# Patient Record
Sex: Female | Born: 1935 | Race: White | Hispanic: No | Marital: Married | Smoking: Never smoker
Health system: Southern US, Community
[De-identification: ages and names within clinical notes are randomized; demographics above are authoritative.]

## PROBLEM LIST (undated history)

## (undated) ENCOUNTER — Emergency Department (HOSPITAL_COMMUNITY): Admission: EM | Discharge status: Against Medical Advice | Payer: Medicare Other

## (undated) DIAGNOSIS — I10 Essential (primary) hypertension: Secondary | ICD-10-CM

## (undated) DIAGNOSIS — M4800 Spinal stenosis, site unspecified: Secondary | ICD-10-CM

## (undated) DIAGNOSIS — M8580 Other specified disorders of bone density and structure, unspecified site: Secondary | ICD-10-CM

## (undated) DIAGNOSIS — M1990 Unspecified osteoarthritis, unspecified site: Secondary | ICD-10-CM

## (undated) HISTORY — PX: ABDOMINAL HYSTERECTOMY: SHX81

## (undated) HISTORY — DX: Unspecified osteoarthritis, unspecified site: M19.90

## (undated) HISTORY — DX: Spinal stenosis, site unspecified: M48.00

## (undated) HISTORY — DX: Other specified disorders of bone density and structure, unspecified site: M85.80

---

## 1999-10-12 ENCOUNTER — Encounter: Payer: Self-pay | Admitting: Internal Medicine

## 1999-10-12 ENCOUNTER — Encounter: Admission: RE | Admit: 1999-10-12 | Discharge: 1999-10-12 | Payer: Self-pay | Admitting: Internal Medicine

## 1999-12-09 ENCOUNTER — Encounter: Admission: RE | Admit: 1999-12-09 | Discharge: 1999-12-09 | Payer: Self-pay | Admitting: *Deleted

## 1999-12-09 ENCOUNTER — Encounter: Payer: Self-pay | Admitting: *Deleted

## 2000-10-13 ENCOUNTER — Encounter: Admission: RE | Admit: 2000-10-13 | Discharge: 2000-10-13 | Payer: Self-pay | Admitting: Internal Medicine

## 2000-10-13 ENCOUNTER — Encounter: Payer: Self-pay | Admitting: Internal Medicine

## 2001-10-17 ENCOUNTER — Encounter: Payer: Self-pay | Admitting: Internal Medicine

## 2001-10-17 ENCOUNTER — Encounter: Admission: RE | Admit: 2001-10-17 | Discharge: 2001-10-17 | Payer: Self-pay | Admitting: Internal Medicine

## 2001-10-25 ENCOUNTER — Encounter: Payer: Self-pay | Admitting: Internal Medicine

## 2001-10-25 ENCOUNTER — Encounter: Admission: RE | Admit: 2001-10-25 | Discharge: 2001-10-25 | Payer: Self-pay | Admitting: Internal Medicine

## 2002-12-12 ENCOUNTER — Encounter: Admission: RE | Admit: 2002-12-12 | Discharge: 2002-12-12 | Payer: Self-pay | Admitting: Internal Medicine

## 2002-12-12 ENCOUNTER — Encounter: Payer: Self-pay | Admitting: Internal Medicine

## 2003-10-01 ENCOUNTER — Ambulatory Visit (HOSPITAL_COMMUNITY): Admission: RE | Admit: 2003-10-01 | Discharge: 2003-10-01 | Payer: Self-pay | Admitting: Internal Medicine

## 2003-10-26 ENCOUNTER — Encounter: Admission: RE | Admit: 2003-10-26 | Discharge: 2003-10-26 | Payer: Self-pay | Admitting: Internal Medicine

## 2003-12-13 ENCOUNTER — Encounter: Admission: RE | Admit: 2003-12-13 | Discharge: 2003-12-13 | Payer: Self-pay | Admitting: Internal Medicine

## 2004-03-05 LAB — HM COLONOSCOPY: HM Colonoscopy: NORMAL

## 2004-12-24 ENCOUNTER — Encounter: Admission: RE | Admit: 2004-12-24 | Discharge: 2004-12-24 | Payer: Self-pay | Admitting: Internal Medicine

## 2005-02-09 ENCOUNTER — Ambulatory Visit: Payer: Self-pay | Admitting: Internal Medicine

## 2005-06-24 ENCOUNTER — Ambulatory Visit: Payer: Self-pay | Admitting: Family Medicine

## 2005-07-26 ENCOUNTER — Ambulatory Visit: Payer: Self-pay | Admitting: Internal Medicine

## 2005-09-14 ENCOUNTER — Ambulatory Visit: Payer: Self-pay | Admitting: Internal Medicine

## 2005-10-05 ENCOUNTER — Ambulatory Visit: Payer: Self-pay | Admitting: Internal Medicine

## 2006-01-06 ENCOUNTER — Ambulatory Visit: Payer: Self-pay | Admitting: Internal Medicine

## 2006-02-25 ENCOUNTER — Encounter: Admission: RE | Admit: 2006-02-25 | Discharge: 2006-02-25 | Payer: Self-pay | Admitting: Internal Medicine

## 2006-03-21 ENCOUNTER — Ambulatory Visit: Payer: Self-pay | Admitting: Internal Medicine

## 2006-06-20 ENCOUNTER — Ambulatory Visit: Payer: Self-pay | Admitting: Family Medicine

## 2006-08-01 ENCOUNTER — Ambulatory Visit: Payer: Self-pay | Admitting: Internal Medicine

## 2006-08-08 ENCOUNTER — Ambulatory Visit: Payer: Self-pay | Admitting: Internal Medicine

## 2006-11-15 HISTORY — PX: ROTATOR CUFF REPAIR: SHX139

## 2007-03-08 ENCOUNTER — Encounter: Admission: RE | Admit: 2007-03-08 | Discharge: 2007-03-08 | Payer: Self-pay | Admitting: Internal Medicine

## 2007-03-21 ENCOUNTER — Ambulatory Visit: Payer: Self-pay | Admitting: Internal Medicine

## 2007-03-21 LAB — CONVERTED CEMR LAB
ALT: 16 units/L (ref 0–40)
Basophils Absolute: 0 10*3/uL (ref 0.0–0.1)
Basophils Relative: 0.1 % (ref 0.0–1.0)
CO2: 30 meq/L (ref 19–32)
Calcium: 9.3 mg/dL (ref 8.4–10.5)
Eosinophils Absolute: 0.1 10*3/uL (ref 0.0–0.6)
Hemoglobin: 13.5 g/dL (ref 12.0–15.0)
Lymphocytes Relative: 23.2 % (ref 12.0–46.0)
MCHC: 34.2 g/dL (ref 30.0–36.0)
MCV: 102.8 fL — ABNORMAL HIGH (ref 78.0–100.0)
Monocytes Absolute: 0.2 10*3/uL (ref 0.2–0.7)
Neutro Abs: 2.2 10*3/uL (ref 1.4–7.7)
Neutrophils Relative %: 64.3 % (ref 43.0–77.0)
Platelets: 263 10*3/uL (ref 150–400)
Potassium: 4.2 meq/L (ref 3.5–5.1)
Sodium: 144 meq/L (ref 135–145)
Total CHOL/HDL Ratio: 2.9
Vit D, 1,25-Dihydroxy: 61 — ABNORMAL HIGH (ref 20–57)

## 2007-07-25 ENCOUNTER — Ambulatory Visit: Payer: Self-pay | Admitting: Internal Medicine

## 2007-07-25 DIAGNOSIS — M899 Disorder of bone, unspecified: Secondary | ICD-10-CM | POA: Insufficient documentation

## 2007-07-25 DIAGNOSIS — M949 Disorder of cartilage, unspecified: Secondary | ICD-10-CM

## 2007-07-25 DIAGNOSIS — D709 Neutropenia, unspecified: Secondary | ICD-10-CM | POA: Insufficient documentation

## 2007-07-25 DIAGNOSIS — M4800 Spinal stenosis, site unspecified: Secondary | ICD-10-CM | POA: Insufficient documentation

## 2007-07-25 DIAGNOSIS — M1990 Unspecified osteoarthritis, unspecified site: Secondary | ICD-10-CM | POA: Insufficient documentation

## 2007-07-25 DIAGNOSIS — J309 Allergic rhinitis, unspecified: Secondary | ICD-10-CM | POA: Insufficient documentation

## 2007-07-31 ENCOUNTER — Telehealth (INDEPENDENT_AMBULATORY_CARE_PROVIDER_SITE_OTHER): Payer: Self-pay | Admitting: *Deleted

## 2007-09-06 ENCOUNTER — Encounter: Payer: Self-pay | Admitting: Internal Medicine

## 2007-09-14 ENCOUNTER — Telehealth (INDEPENDENT_AMBULATORY_CARE_PROVIDER_SITE_OTHER): Payer: Self-pay | Admitting: *Deleted

## 2007-09-19 ENCOUNTER — Ambulatory Visit: Payer: Self-pay | Admitting: Internal Medicine

## 2007-09-25 LAB — CONVERTED CEMR LAB
Cholesterol: 210 mg/dL (ref 0–200)
Eosinophils Relative: 6.2 % — ABNORMAL HIGH (ref 0.0–5.0)
HDL: 66.3 mg/dL (ref >39.0)
Hemoglobin: 13.9 g/dL (ref 12.0–15.0)
RBC: 3.87 M/uL (ref 3.87–5.11)
Total CHOL/HDL Ratio: 3.2
VLDL: 10 mg/dL (ref 0–40)
WBC: 2.9 10*3/uL — ABNORMAL LOW (ref 4.5–10.5)

## 2007-10-04 ENCOUNTER — Ambulatory Visit: Payer: Self-pay | Admitting: Internal Medicine

## 2007-10-04 DIAGNOSIS — H9319 Tinnitus, unspecified ear: Secondary | ICD-10-CM | POA: Insufficient documentation

## 2007-11-01 ENCOUNTER — Ambulatory Visit: Payer: Self-pay | Admitting: Internal Medicine

## 2007-11-06 ENCOUNTER — Ambulatory Visit: Payer: Self-pay | Admitting: Internal Medicine

## 2007-11-06 LAB — CONVERTED CEMR LAB
Basophils Absolute: 0 10*3/uL (ref 0.0–0.1)
Basophils Relative: 0.3 % (ref 0.0–1.0)
Eosinophils Absolute: 0.2 10*3/uL (ref 0.0–0.6)
Eosinophils Relative: 3.3 % (ref 0.0–5.0)
Glucose, Urine, Semiquant: NEGATIVE
HCT: 36.9 % (ref 36.0–46.0)
Ketones, urine, test strip: NEGATIVE
Lymphocytes Relative: 22.6 % (ref 12.0–46.0)
MCHC: 34.3 g/dL (ref 30.0–36.0)
Monocytes Absolute: 0.3 10*3/uL (ref 0.2–0.7)
Monocytes Relative: 6.4 % (ref 3.0–11.0)
Platelets: 237 10*3/uL (ref 150–400)
RBC: 3.62 M/uL — ABNORMAL LOW (ref 3.87–5.11)
RDW: 11.7 % (ref 11.5–14.6)
WBC: 4.9 10*3/uL (ref 4.5–10.5)

## 2007-11-07 ENCOUNTER — Encounter: Payer: Self-pay | Admitting: Internal Medicine

## 2007-11-23 ENCOUNTER — Telehealth: Payer: Self-pay | Admitting: Internal Medicine

## 2007-12-13 ENCOUNTER — Telehealth: Payer: Self-pay | Admitting: Internal Medicine

## 2007-12-15 ENCOUNTER — Telehealth: Payer: Self-pay | Admitting: Internal Medicine

## 2008-03-11 ENCOUNTER — Encounter: Admission: RE | Admit: 2008-03-11 | Discharge: 2008-03-11 | Payer: Self-pay | Admitting: Internal Medicine

## 2008-04-22 ENCOUNTER — Ambulatory Visit: Payer: Self-pay | Admitting: Internal Medicine

## 2008-04-22 DIAGNOSIS — E785 Hyperlipidemia, unspecified: Secondary | ICD-10-CM | POA: Insufficient documentation

## 2008-04-22 DIAGNOSIS — G4700 Insomnia, unspecified: Secondary | ICD-10-CM | POA: Insufficient documentation

## 2008-05-07 LAB — CONVERTED CEMR LAB
ALT: 15 units/L (ref 0–35)
AST: 21 units/L (ref 0–37)
Albumin: 4.3 g/dL (ref 3.5–5.2)
Alkaline Phosphatase: 62 units/L (ref 39–117)
Bilirubin, Direct: 0.1 mg/dL (ref 0.0–0.3)
CO2: 28 meq/L (ref 19–32)
Cholesterol: 211 mg/dL (ref 0–200)
Creatinine, Ser: 0.8 mg/dL (ref 0.4–1.2)
GFR calc non Af Amer: 74 mL/min
Glucose, Bld: 97 mg/dL (ref 70–99)
HCT: 40.9 % (ref 36.0–46.0)
HDL: 73.8 mg/dL (ref >39.0)
Hemoglobin: 14.2 g/dL (ref 12.0–15.0)
MCHC: 34.7 g/dL (ref 30.0–36.0)
Neutrophils Relative %: 63.2 % (ref 43.0–77.0)
Potassium: 5.1 meq/L (ref 3.5–5.1)
RDW: 12.5 % (ref 11.5–14.6)
Sodium: 142 meq/L (ref 135–145)
Total Protein: 6.6 g/dL (ref 6.0–8.3)
Triglycerides: 72 mg/dL (ref 0–149)
WBC: 3.4 10*3/uL — ABNORMAL LOW (ref 4.5–10.5)

## 2008-08-05 ENCOUNTER — Encounter: Payer: Self-pay | Admitting: Internal Medicine

## 2008-08-05 ENCOUNTER — Ambulatory Visit: Payer: Self-pay | Admitting: Internal Medicine

## 2008-09-23 ENCOUNTER — Telehealth: Payer: Self-pay | Admitting: Internal Medicine

## 2008-10-28 ENCOUNTER — Telehealth: Payer: Self-pay | Admitting: Internal Medicine

## 2009-03-27 ENCOUNTER — Encounter: Admission: RE | Admit: 2009-03-27 | Discharge: 2009-03-27 | Payer: Self-pay | Admitting: Internal Medicine

## 2009-05-09 ENCOUNTER — Ambulatory Visit: Payer: Self-pay | Admitting: Internal Medicine

## 2009-05-12 ENCOUNTER — Telehealth: Payer: Self-pay | Admitting: Internal Medicine

## 2009-05-26 ENCOUNTER — Ambulatory Visit: Payer: Self-pay | Admitting: Internal Medicine

## 2009-05-27 ENCOUNTER — Encounter: Payer: Self-pay | Admitting: Internal Medicine

## 2009-05-30 LAB — CONVERTED CEMR LAB
ALT: 13 units/L (ref 0–35)
AST: 20 units/L (ref 0–37)
BUN: 19 mg/dL (ref 6–23)
Basophils Absolute: 0 10*3/uL (ref 0.0–0.1)
CO2: 29 meq/L (ref 19–32)
Cholesterol: 237 mg/dL — ABNORMAL HIGH (ref 0–200)
Eosinophils Relative: 4.9 % (ref 0.0–5.0)
GFR calc non Af Amer: 64.65 mL/min (ref >60)
Glucose, Bld: 99 mg/dL (ref 70–99)
HCT: 40.5 % (ref 36.0–46.0)
HDL: 68.6 mg/dL (ref >39.00)
Lymphocytes Relative: 18.6 % (ref 12.0–46.0)
Lymphs Abs: 0.6 10*3/uL — ABNORMAL LOW (ref 0.7–4.0)
TSH: 2.77 microintl units/mL (ref 0.35–5.50)
VLDL: 17.4 mg/dL (ref 0.0–40.0)
WBC: 3.2 10*3/uL — ABNORMAL LOW (ref 4.5–10.5)

## 2009-06-26 ENCOUNTER — Ambulatory Visit: Payer: Self-pay | Admitting: Family Medicine

## 2009-06-26 LAB — CONVERTED CEMR LAB
Bilirubin Urine: NEGATIVE
Nitrite: POSITIVE
Specific Gravity, Urine: 1.02
Urobilinogen, UA: 0.2

## 2009-10-16 ENCOUNTER — Ambulatory Visit: Payer: Self-pay | Admitting: Internal Medicine

## 2009-10-20 LAB — CONVERTED CEMR LAB
Basophils Absolute: 0 10*3/uL (ref 0.0–0.1)
Hemoglobin: 12.7 g/dL (ref 12.0–15.0)
Lymphocytes Relative: 13.7 % (ref 12.0–46.0)
Lymphs Abs: 0.7 10*3/uL (ref 0.7–4.0)
MCHC: 34 g/dL (ref 30.0–36.0)
MCV: 106.4 fL — ABNORMAL HIGH (ref 78.0–100.0)
Monocytes Relative: 6.6 % (ref 3.0–12.0)
Neutro Abs: 3.7 10*3/uL (ref 1.4–7.7)
Neutrophils Relative %: 77.2 % — ABNORMAL HIGH (ref 43.0–77.0)
RBC: 3.52 M/uL — ABNORMAL LOW (ref 3.87–5.11)
WBC: 4.8 10*3/uL (ref 4.5–10.5)

## 2009-12-22 ENCOUNTER — Ambulatory Visit: Payer: Self-pay | Admitting: Internal Medicine

## 2009-12-22 DIAGNOSIS — R82998 Other abnormal findings in urine: Secondary | ICD-10-CM | POA: Insufficient documentation

## 2009-12-22 LAB — CONVERTED CEMR LAB
Bilirubin Urine: NEGATIVE
Nitrite: POSITIVE
Protein, U semiquant: NEGATIVE
Specific Gravity, Urine: 1.02

## 2010-01-07 ENCOUNTER — Telehealth: Payer: Self-pay | Admitting: *Deleted

## 2010-01-07 ENCOUNTER — Ambulatory Visit: Payer: Self-pay | Admitting: Internal Medicine

## 2010-01-07 LAB — CONVERTED CEMR LAB
Bilirubin Urine: NEGATIVE
Ketones, ur: NEGATIVE mg/dL
Nitrite: POSITIVE
Total Protein, Urine: 100 mg/dL
Urine Glucose: NEGATIVE mg/dL
pH: 6 (ref 5.0–8.0)

## 2010-02-04 ENCOUNTER — Telehealth: Payer: Self-pay | Admitting: *Deleted

## 2010-03-31 ENCOUNTER — Encounter: Admission: RE | Admit: 2010-03-31 | Discharge: 2010-03-31 | Payer: Self-pay | Admitting: Internal Medicine

## 2010-03-31 LAB — HM MAMMOGRAPHY

## 2010-05-12 ENCOUNTER — Ambulatory Visit: Payer: Self-pay | Admitting: Internal Medicine

## 2010-05-12 LAB — CONVERTED CEMR LAB
ALT: 16 units/L (ref 0–35)
AST: 20 units/L (ref 0–37)
Bilirubin Urine: NEGATIVE
CO2: 31 meq/L (ref 19–32)
Calcium: 9.5 mg/dL (ref 8.4–10.5)
Cholesterol: 244 mg/dL — ABNORMAL HIGH (ref 0–200)
Creatinine, Ser: 0.9 mg/dL (ref 0.4–1.2)
Eosinophils Absolute: 0.1 10*3/uL (ref 0.0–0.7)
Glucose, Bld: 92 mg/dL (ref 70–99)
HDL: 84.8 mg/dL (ref >39.00)
Lymphocytes Relative: 26.7 % (ref 12.0–46.0)
Monocytes Absolute: 0.3 10*3/uL (ref 0.1–1.0)
Neutrophils Relative %: 60.8 % (ref 43.0–77.0)
Nitrite: NEGATIVE
Platelets: 224 10*3/uL (ref 150.0–400.0)
RBC: 3.89 M/uL (ref 3.87–5.11)
Sodium: 144 meq/L (ref 135–145)
Total Bilirubin: 1.1 mg/dL (ref 0.3–1.2)
Total CHOL/HDL Ratio: 3
Total Protein: 6.7 g/dL (ref 6.0–8.3)
Triglycerides: 58 mg/dL (ref 0.0–149.0)
VLDL: 11.6 mg/dL (ref 0.0–40.0)
pH: 6

## 2010-05-27 ENCOUNTER — Ambulatory Visit: Payer: Self-pay | Admitting: Internal Medicine

## 2010-11-19 ENCOUNTER — Ambulatory Visit
Admission: RE | Admit: 2010-11-19 | Discharge: 2010-11-19 | Payer: Self-pay | Source: Home / Self Care | Attending: Internal Medicine | Admitting: Internal Medicine

## 2010-11-19 DIAGNOSIS — J45901 Unspecified asthma with (acute) exacerbation: Secondary | ICD-10-CM | POA: Insufficient documentation

## 2010-11-24 ENCOUNTER — Telehealth: Payer: Self-pay | Admitting: Internal Medicine

## 2010-11-24 IMAGING — CR DG CHEST 2V
2 series · 2 of 2 positions shown · non-contrast
Comparison: None available.

CLINICAL DATA: Back pain.

CHEST - 2 VIEW

[view not recorded (1 of 2)]
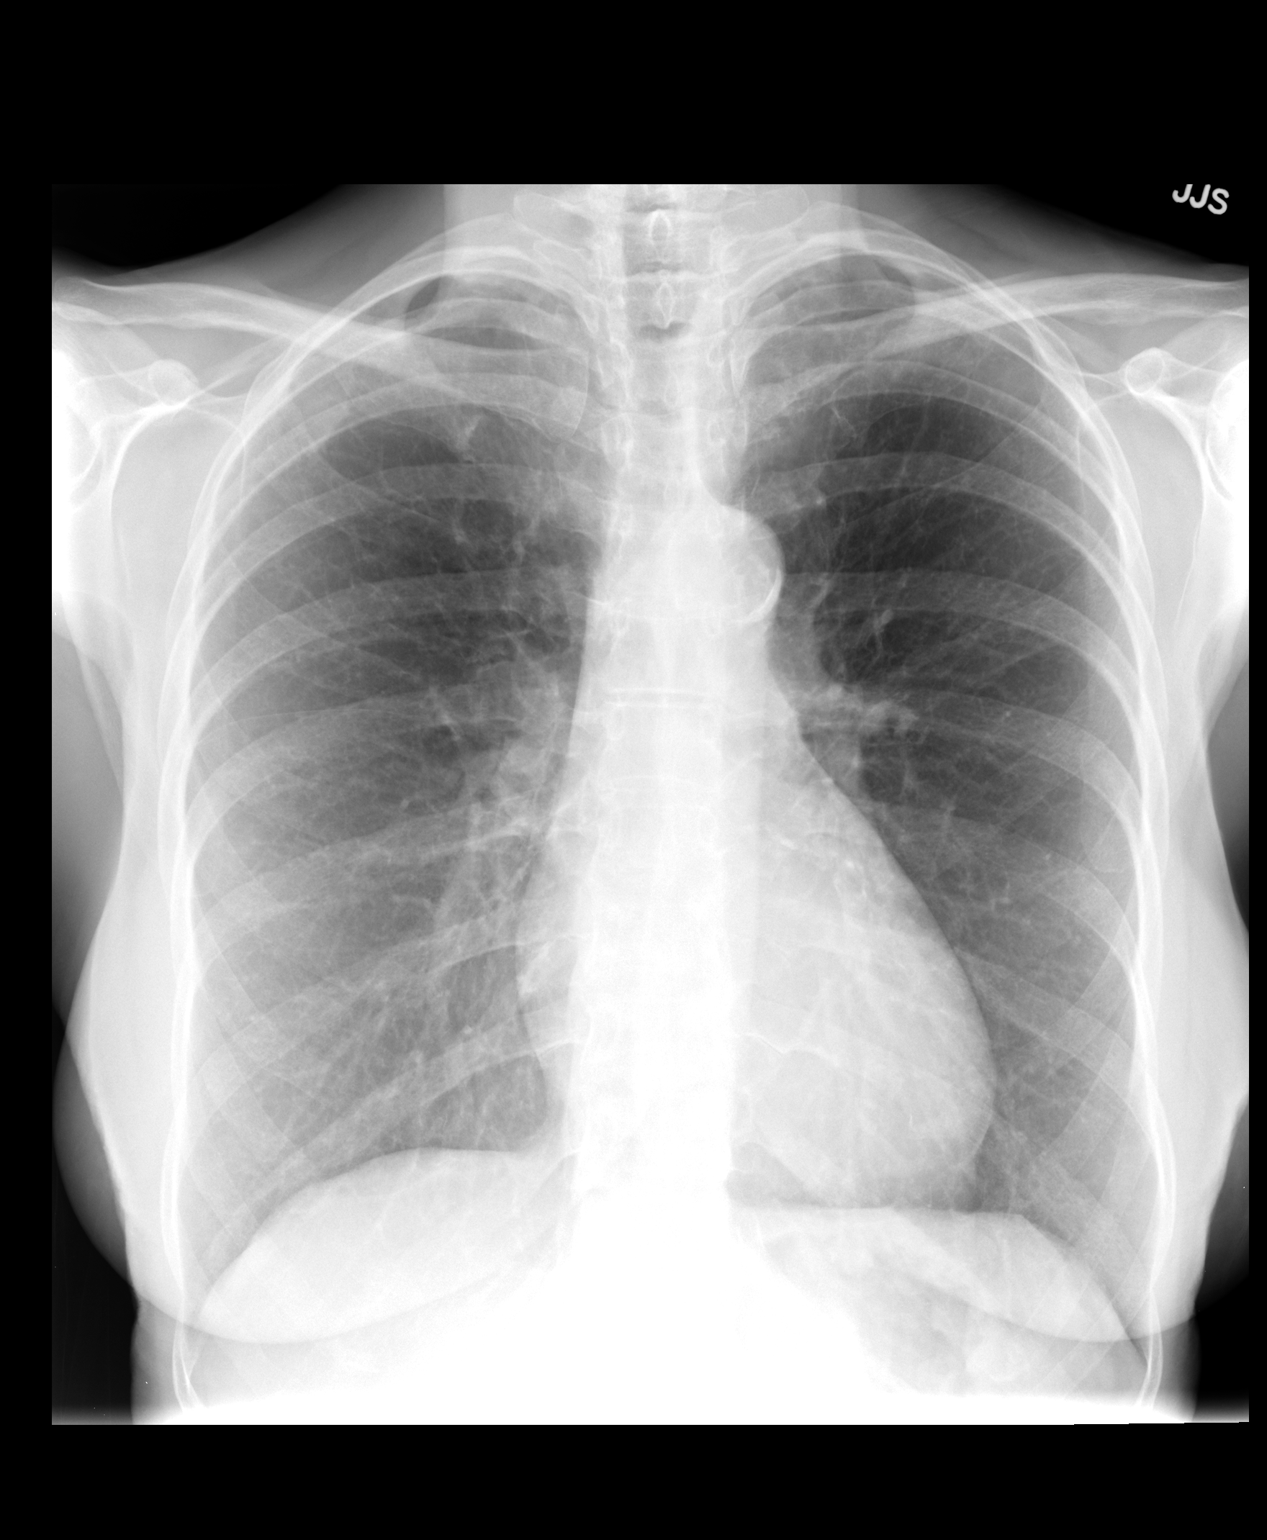

[view not recorded (2 of 2)]
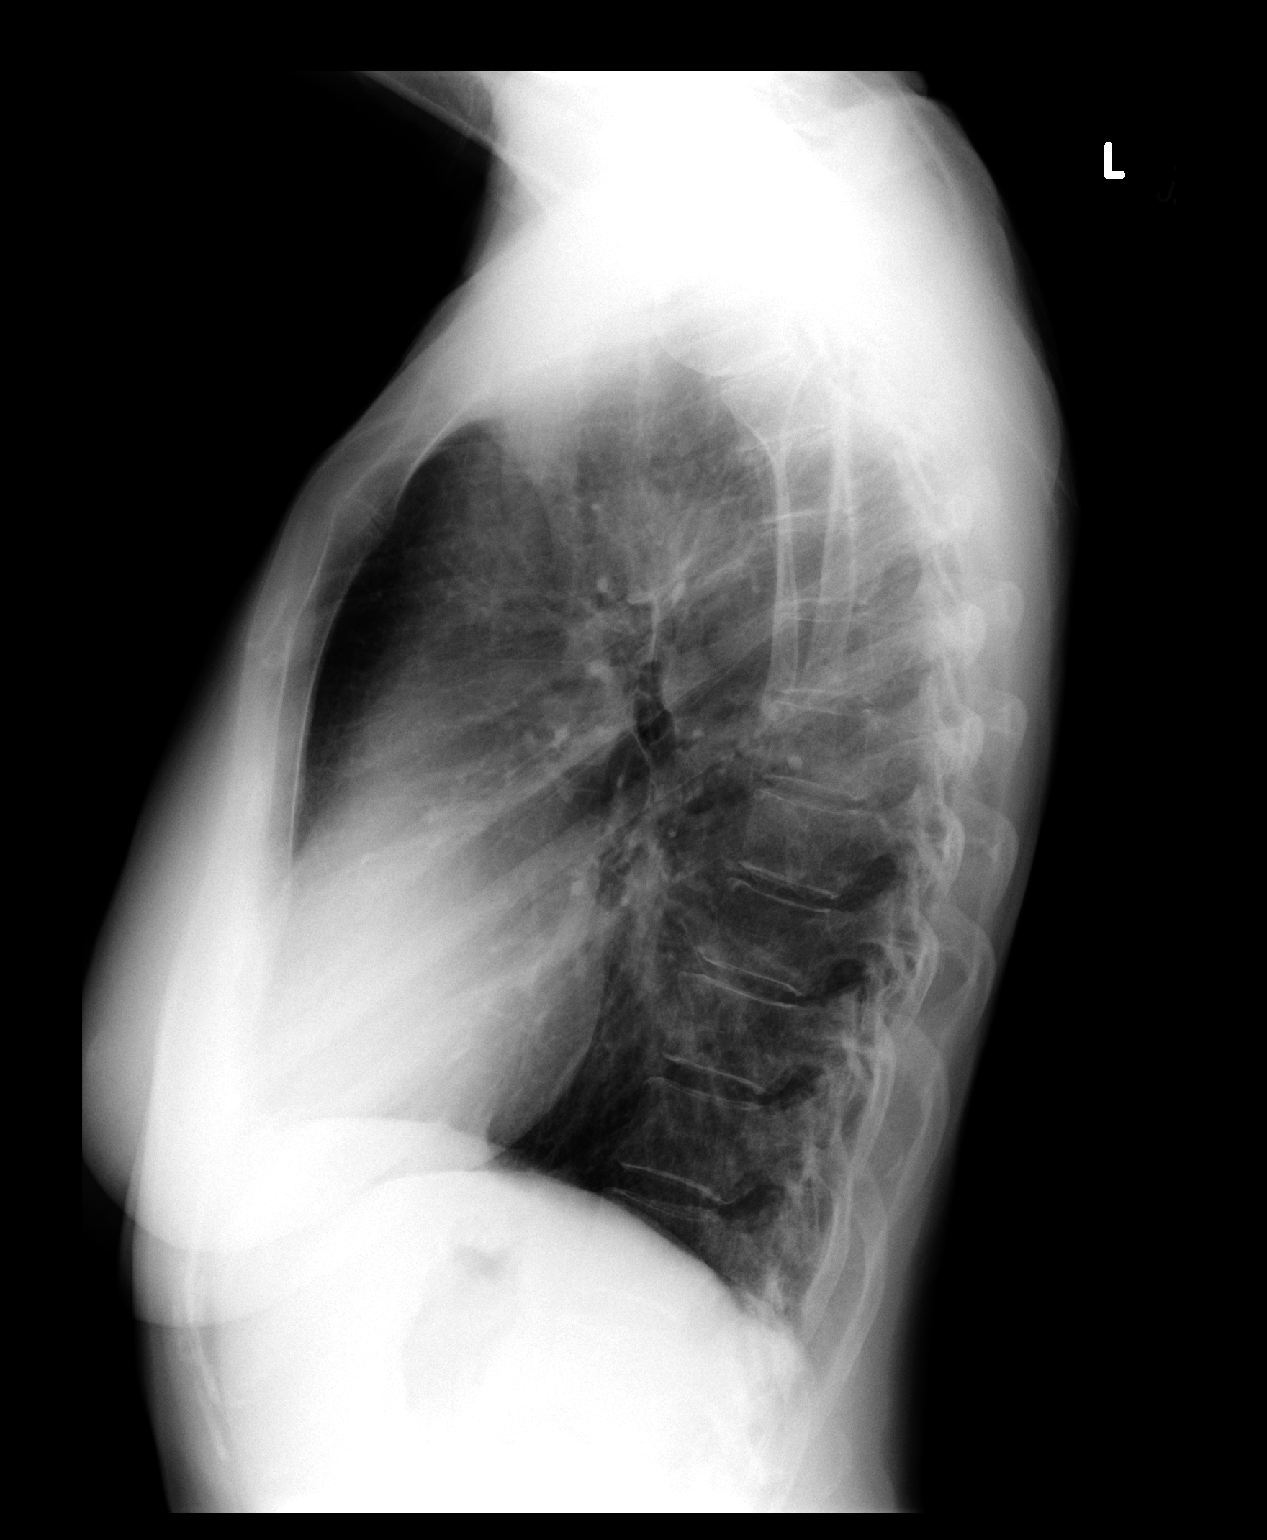

[2 of 2 positions shown; findings below may reference images not displayed]

FINDINGS: There is some apical pleural and parenchymal scarring.
Lungs otherwise clear.  No effusion.  Heart size normal.
IMPRESSION: No acute disease.

## 2010-11-30 ENCOUNTER — Telehealth: Payer: Self-pay | Admitting: Internal Medicine

## 2010-12-03 ENCOUNTER — Other Ambulatory Visit: Payer: Self-pay | Admitting: Internal Medicine

## 2010-12-03 ENCOUNTER — Ambulatory Visit: Admit: 2010-12-03 | Payer: Self-pay | Admitting: Internal Medicine

## 2010-12-03 ENCOUNTER — Ambulatory Visit
Admission: RE | Admit: 2010-12-03 | Discharge: 2010-12-03 | Payer: Self-pay | Source: Home / Self Care | Attending: Internal Medicine | Admitting: Internal Medicine

## 2010-12-03 DIAGNOSIS — T50995A Adverse effect of other drugs, medicaments and biological substances, initial encounter: Secondary | ICD-10-CM | POA: Insufficient documentation

## 2010-12-03 DIAGNOSIS — R5381 Other malaise: Secondary | ICD-10-CM | POA: Insufficient documentation

## 2010-12-03 DIAGNOSIS — R5383 Other fatigue: Secondary | ICD-10-CM

## 2010-12-03 LAB — CONVERTED CEMR LAB
Blood in Urine, dipstick: NEGATIVE
Ketones, urine, test strip: NEGATIVE
Protein, U semiquant: NEGATIVE
Specific Gravity, Urine: 1.02
pH: 6

## 2010-12-03 LAB — BASIC METABOLIC PANEL
BUN: 18 mg/dL (ref 6–23)
CO2: 29 mEq/L (ref 19–32)
Calcium: 8.9 mg/dL (ref 8.4–10.5)
Chloride: 104 mEq/L (ref 96–112)
Creatinine, Ser: 0.8 mg/dL (ref 0.4–1.2)
GFR: 72.71 mL/min (ref >60.00)
Glucose, Bld: 91 mg/dL (ref 70–99)
Potassium: 4.4 mEq/L (ref 3.5–5.1)
Sodium: 139 mEq/L (ref 135–145)

## 2010-12-03 LAB — CBC WITH DIFFERENTIAL/PLATELET
Basophils Absolute: 0 10*3/uL (ref 0.0–0.1)
Basophils Relative: 0.3 % (ref 0.0–3.0)
Eosinophils Absolute: 0.2 10*3/uL (ref 0.0–0.7)
Eosinophils Relative: 5.5 % — ABNORMAL HIGH (ref 0.0–5.0)
HCT: 40.9 % (ref 36.0–46.0)
Hemoglobin: 13.9 g/dL (ref 12.0–15.0)
Lymphocytes Relative: 18.4 % (ref 12.0–46.0)
Lymphs Abs: 0.8 10*3/uL (ref 0.7–4.0)
MCHC: 34.1 g/dL (ref 30.0–36.0)
MCV: 104.1 fl — ABNORMAL HIGH (ref 78.0–100.0)
Monocytes Absolute: 0.3 10*3/uL (ref 0.1–1.0)
Monocytes Relative: 7.4 % (ref 3.0–12.0)
Neutro Abs: 3 10*3/uL (ref 1.4–7.7)
Neutrophils Relative %: 68.4 % (ref 43.0–77.0)
Platelets: 225 10*3/uL (ref 150.0–400.0)
RBC: 3.93 Mil/uL (ref 3.87–5.11)
RDW: 12.8 % (ref 11.5–14.6)
WBC: 4.4 10*3/uL — ABNORMAL LOW (ref 4.5–10.5)

## 2010-12-03 LAB — LIPID PANEL
Cholesterol: 234 mg/dL — ABNORMAL HIGH (ref 0–200)
HDL: 73 mg/dL (ref >39.00)
Total CHOL/HDL Ratio: 3
Triglycerides: 102 mg/dL (ref 0.0–149.0)
VLDL: 20.4 mg/dL (ref 0.0–40.0)

## 2010-12-03 LAB — HEPATIC FUNCTION PANEL
ALT: 12 U/L (ref 0–35)
AST: 17 U/L (ref 0–37)
Albumin: 3.9 g/dL (ref 3.5–5.2)
Alkaline Phosphatase: 52 U/L (ref 39–117)
Bilirubin, Direct: 0.1 mg/dL (ref 0.0–0.3)
Total Bilirubin: 0.8 mg/dL (ref 0.3–1.2)
Total Protein: 6 g/dL (ref 6.0–8.3)

## 2010-12-03 LAB — LDL CHOLESTEROL, DIRECT: Direct LDL: 147.8 mg/dL

## 2010-12-03 LAB — SEDIMENTATION RATE: Sed Rate: 7 mm/hr (ref 0–22)

## 2010-12-03 LAB — TSH: TSH: 1.8 u[IU]/mL (ref 0.35–5.50)

## 2010-12-08 ENCOUNTER — Ambulatory Visit
Admission: RE | Admit: 2010-12-08 | Discharge: 2010-12-08 | Payer: Self-pay | Source: Home / Self Care | Attending: Internal Medicine | Admitting: Internal Medicine

## 2010-12-08 ENCOUNTER — Ambulatory Visit: Admit: 2010-12-08 | Payer: Self-pay | Admitting: Internal Medicine

## 2010-12-15 ENCOUNTER — Ambulatory Visit
Admission: RE | Admit: 2010-12-15 | Discharge: 2010-12-15 | Payer: Self-pay | Source: Home / Self Care | Attending: Internal Medicine | Admitting: Internal Medicine

## 2010-12-15 DIAGNOSIS — J019 Acute sinusitis, unspecified: Secondary | ICD-10-CM | POA: Insufficient documentation

## 2010-12-15 NOTE — Progress Notes (Signed)
Summary: Pt is having urgency, burning and urinary frequency again  Phone Note Call from Patient Call back at Home Phone [phone]   Caller: Patient Summary of Call: Pt woke up during the night last night and is now having the pressure, urgency and burning. Pt is willing to drop off a urine at [facility] since it's closer if needs to. Initial call taken by: Romualdo Bolk, CMA (AAMA),  January 07, 2010 10:12 AM  Follow-up for Phone Call        Per Dr. Fabian Sharp- have pt get a ua with micro at [facility]. Pt aware and order put in EMR. Follow-up by: Romualdo Bolk, CMA (AAMA),  January 07, 2010 11:01 AM

## 2010-12-15 NOTE — Progress Notes (Signed)
Summary: refill on zolpidem  Phone Note From Pharmacy   Caller: [facility]* Reason for Call: Needs renewal Details for Reason: Zolpidem 10mg  Summary of Call: last filled on 05/26/09 #30 Initial call taken by: Romualdo Bolk, CMA (AAMA),  February 04, 2010 5:02 PM  Follow-up for Phone Call        ok to refill  Follow-up by: Madelin Headings MD,  February 04, 2010 8:57 PM  Additional Follow-up for Phone Call Additional follow up Details #1::        Sent back to pharmacy via fax. Additional Follow-up by: Romualdo Bolk, CMA Duncan Dull),  February 05, 2010 9:31 AM    Prescriptions: AMBIEN 10 MG  TABS (ZOLPIDEM TARTRATE) 1 by mouth at bedtime  #30 x 0   Entered by:   Romualdo Bolk, CMA (AAMA)   Authorized by:   Madelin Headings MD   Signed by:   Romualdo Bolk, CMA (AAMA) on 02/05/2010   Method used:   Handwritten   RxID:   [id number]

## 2010-12-15 NOTE — Assessment & Plan Note (Signed)
Summary: yearly phys/mae rsc per bmp/njr   Vital Signs:  Patient profile:   77 year old female Menstrual status:  hysterectomy Height:      62.5 inches Weight:      112 pounds BMI:     20.23 Pulse rate:   78 / minute BP sitting:   120 / 80  (right arm) Cuff size:   regular  Vitals Entered By: Romualdo Bolk, CMA (AAMA) (May 27, 2010 1:57 PM)  Contraindications/Deferment of Procedures/Staging:    Test/Procedure: PAP Smear    Reason for deferment: hysterectomy  CC: Annual Visit for Disease Management   History of Present Illness: Kelly Wolfe comes in today  for yearly check  and follow up . Since last visit  here  there have been no major changes in health status  .  did have some UTIS but no recent  recurrence.  UTD on colon mammo.  Here for Medicare AWV:  1.   Risk factors based on Past M, S, F history: 2.   Physical Activities:  mowing  HAw  and  walkina and   tv tape .  3.   Depression/mood:  down at times     not hopelessness  4.   Hearing:  normal 5.   ADL's:    independent    6.   Fall Risk:  no falling  7.   Home Safety:   healthy 8.   Height, weight, &visual acuity:   Had check in april and cataract.   right   and astygmatism 9.   Counseling:  10.   Labs ordered based on risk factors:    11.           Referral Coordination 12.           Care Plan 13.            Cognitive Assessment  Pt is A&Ox3,affect,speech,memory,attention,&motor skills appear intact.     Spinal stenosis   wawlkin well no inc pain recently  Leukopenia  no infections  or bleeding  .  Preventive Care Screening  Prior Values:    Mammogram:  ASSESSMENT: Negative - BI-RADS 1^MM DIGITAL SCREENING (03/31/2010)    Colonoscopy:  normal (11/16/2003)    Last Tetanus Booster:  Td (03/21/2006)    Last Pneumovax:  Pneumovax (Medicare) (05/26/2009)   Preventive Screening-Counseling & Management  Alcohol-Tobacco     Alcohol drinks/day: 1     Alcohol type: wine and scotch     Smoking Status:  never     Tobacco Counseling: not indicated; no tobacco use  Caffeine-Diet-Exercise     Caffeine use/day: 2     Does Patient Exercise: yes     Type of exercise: walking and areobics     Times/week: 3     MSH Depression Score: see hx   Hep-HIV-STD-Contraception     Dental Visit-last 6 months yes     Sun Exposure-Excessive: yes     Sun Exposure Counseling: not indicated; sun exposure is acceptable  Safety-Violence-Falls     Seat Belt Use: yes     Firearms in the Home: firearms in the home     Firearm Counseling: not indicated; uses recommended firearm safety measures     Smoke Detectors: yes     Fall Risk: none   Comments: sees dermatologist.   Current Medications (verified): 1)  Calcium 500/d 500-125 Mg-Unit  Tabs (Calcium Carbonate-Vitamin D) 2)  Flonase 50 Mcg/act  Susp (Fluticasone Propionate) .... 2 in Each Nostril  Daily 3)  Multi-Vitamin   Tabs (Multiple Vitamin) 4)  Zyrtec Allergy 10 Mg  Tabs (Cetirizine Hcl) 5)  Ambien 10 Mg  Tabs (Zolpidem Tartrate) .Marland Kitchen.. 1 By Mouth At Bedtime  Allergies (verified): No Known Drug Allergies  Past History:  Past Medical History: Allergic rhinitis Osteoarthritis neutropenia   with elevated MCV  no dx   Saw HEME in the past neg rheum  zieminski spinal stenosis Dexa 2007 mild osteopenia  -0.7 hip sp nl  Past Surgical History: Hysterectomy  total Rotator cuff repair 1/08 right Colonscopy  Past History:  Care Management: Dermatology: Emily Filbert Orthopedics: [facility] Has Heme consult in the past rheum consiutl in past   Social History: Fall Risk:  none  Dental Care w/in 6 mos.:  yes Sun Exposure-Excessive:  yes  Review of Systems  The patient denies anorexia, fever, weight loss, weight gain, decreased hearing, hoarseness, chest pain, syncope, dyspnea on exertion, peripheral edema, prolonged cough, hemoptysis, abdominal pain, melena, hematochezia, severe indigestion/heartburn, hematuria, muscle weakness, transient blindness,  difficulty walking, abnormal bleeding, enlarged lymph nodes, angioedema, and breast masses.         neck  cracks   left arm hand numb in am and resolves with position change no weakness  back is stable flonase for nasal congestion  Physical Exam  General:  Well-developed,well-nourished,in no acute distress; alert,appropriate and cooperative throughout examination Head:  normocephalic and atraumatic.   Eyes:  PERRL, EOMs full, conjunctiva clear  Ears:  R ear normal and L ear normal.  no external deformities.   Nose:  no external deformity, no external erythema, and no nasal discharge.   Mouth:  pharynx pink and moist.   no lesion Neck:  No deformities, masses, or tenderness noted. Breasts:  No mass, nodules, thickening, tenderness, bulging, retraction, inflamation, nipple discharge or skin changes noted.   Lungs:  Normal respiratory effort, chest expands symmetrically. Lungs are clear to auscultation, no crackles or wheezes. Heart:  Normal rate and regular rhythm. S1 and S2 normal without gallop, murmur, click, rub or other extra sounds.no lifts.   Abdomen:  Bowel sounds positive,abdomen soft and non-tender without masses, organomegaly or hernias noted. Genitalia:  had hysterectomy  Msk:  no joint warmth and no redness over joints.  oa changes  nl strength Pulses:  pulses intact without delay   Extremities:  no clubbing cyanosis or edema  Neurologic:  alert & oriented X3, strength normal in all extremities, gait normal, and DTRs symmetrical and normal.   Skin:  sunchanges    turgor normal, no ecchymoses, no petechiae, and no purpura.   Cervical Nodes:  No lymphadenopathy noted Axillary Nodes:  No palpable lymphadenopathy Inguinal Nodes:  No significant adenopathy Psych:  Oriented X3, good eye contact, and not anxious appearing.  mildy subdued    Impression & Recommendations:  Problem # 1:  Preventive Health Care (ICD-V70.0) Discussed nutrition,exercise,diet,healthy weight, vitamin D  and calcium.   injury prevention sun and depression care.   Problem # 2:  SLEEPLESSNESS (ICD-780.52) caution with alcohol  Her updated medication list for this problem includes:    Ambien 10 Mg Tabs (Zolpidem tartrate) .Marland Kitchen... 1 by mouth at bedtime  Problem # 3:  NEUTROPENIA NOS (ICD-288.00) with elvated mcv    some alcohol but says  equiv 1 per hs   evalauted by hem in past   plan monitor for progression but no lsx   Problem # 4:  SPINAL STENOSIS (ICD-724.00) stable   Problem # 5:  HYPERLIPIDEMIA, MILD (ICD-272.4) good ratio  Labs Reviewed: SGOT: 20 (05/12/2010)   SGPT: 16 (05/12/2010)   HDL:84.80 (05/12/2010), 68.60 (05/26/2009)  LDL:DEL (04/22/2008), DEL (09/19/2007)  Chol:244 (05/12/2010), 237 (05/26/2009)  Trig:58.0 (05/12/2010), 87.0 (05/26/2009)  Problem # 6:  ALLERGIC RHINITIS (ICD-477.9) Assessment: Unchanged  refill  med   Her updated medication list for this problem includes:    Flonase 50 Mcg/act Susp (Fluticasone propionate) .Marland Kitchen... 2 in each nostril daily    Zyrtec Allergy 10 Mg Tabs (Cetirizine hcl)  Orders: Prescription Created Electronically [phone])  Complete Medication List: 1)  Calcium 500/d 500-125 Mg-unit Tabs (Calcium carbonate-vitamin d) 2)  Flonase 50 Mcg/act Susp (Fluticasone propionate) .... 2 in each nostril daily 3)  Multi-vitamin Tabs (Multiple vitamin) 4)  Zyrtec Allergy 10 Mg Tabs (Cetirizine hcl) 5)  Ambien 10 Mg Tabs (Zolpidem tartrate) .Marland Kitchen.. 1 by mouth at bedtime  Patient Instructions: 1)  continue healthy eating and exercise . 2)  caution with  alcohol and ambien. 3)  CBC diff in 6 months .  leukopenia 4)  check up in 1 year  Prescriptions: FLONASE 50 MCG/ACT  SUSP (FLUTICASONE PROPIONATE) 2 in each nostril daily  #1 x prn   Entered and Authorized by:   Madelin Headings MD   Signed by:   Madelin Headings MD on 05/27/2010   Method used:   Electronically to        [facility]* (retail)       [address] [place], [place]  [address]       Ph: [phone] or [phone]       Fax: [phone]   RxID:   [id number] AMBIEN 10 MG  TABS (ZOLPIDEM TARTRATE) 1 by mouth at bedtime  #30 x 5   Entered and Authorized by:   Madelin Headings MD   Signed by:   Madelin Headings MD on 05/27/2010   Method used:   Print then Give to Patient   RxID:   [id number]    Eye Exam  Last eye exam- February 13, 2010- Normal- Done by Dr. Bronwen Betters, CMA (AAMA)  May 27, 2010 2:01 PM

## 2010-12-15 NOTE — Assessment & Plan Note (Signed)
Summary: ? BLADDER ISSUES//CCM   Vital Signs:  Patient profile:   77 year old female Menstrual status:  hysterectomy Weight:      118 pounds Temp:     98.0 degrees F oral Pulse rate:   78 / minute BP sitting:   100 / 60  (right arm) Cuff size:   regular  Vitals Entered By: Romualdo Bolk, CMA (AAMA) (December 22, 2009 1:36 PM) CC: Burning upon urination, frequency and urgency.    History of Present Illness: Kelly Wolfe comes  in  for SDA   with 3 days of above .   Seems like a bladder infection that she has had before .Marland Kitchen LAst one in Summer fall.  NO fever or flank pain or hematuria. Took azo OTC with some help.    NO change in health status from  last viist otherwise.    Preventive Screening-Counseling & Management  Alcohol-Tobacco     Alcohol drinks/day: <1     Alcohol type: wine and scotch     Smoking Status: never  Caffeine-Diet-Exercise     Caffeine use/day: 2     Does Patient Exercise: yes     Type of exercise: walking and areobics     Times/week: 3  Current Medications (verified): 1)  Calcium 500/d 500-125 Mg-Unit  Tabs (Calcium Carbonate-Vitamin D) 2)  Flonase 50 Mcg/act  Susp (Fluticasone Propionate) .... 2 in Each Nostril Daily 3)  Multi-Vitamin   Tabs (Multiple Vitamin) 4)  Zyrtec Allergy 10 Mg  Tabs (Cetirizine Hcl) 5)  Ambien 10 Mg  Tabs (Zolpidem Tartrate) .Marland Kitchen.. 1 By Mouth At Bedtime  Allergies (verified): No Known Drug Allergies  Past History:  Past medical, surgical, family and social histories (including risk factors) reviewed for relevance to current acute and chronic problems.  Past Medical History: Reviewed history from 05/09/2009 and no changes required. Allergic rhinitis Osteoarthritis neutropenia  Saw HEME in the past spinal stenosis  Past Surgical History: Reviewed history from 04/22/2008 and no changes required. Hysterectomy Rotator cuff repair 1/08 Colonscopy  Past History:  Care Management: Dermatology: Emily Filbert Orthopedics:  [facility] Has Heme consult in the past  Family History: Reviewed history from 11/06/2007 and no changes required. no change  Social History: Reviewed history from 04/22/2008 and no changes required. Retired Widowed    7/04      hh of 1  Never Smoked Alcohol use-yes  1 per day  Drug use-no Regular exercise-yes  Review of Systems  The patient denies anorexia, fever, weight loss, weight gain, abdominal pain, melena, hematochezia, severe indigestion/heartburn, hematuria, genital sores, enlarged lymph nodes, and angioedema.    Physical Exam  General:  Well-developed,well-nourished,in no acute distress; alert,appropriate and cooperative throughout examination Head:  normocephalic and atraumatic.   Neck:  No deformities, masses, or tenderness noted. Lungs:  normal respiratory effort, no intercostal retractions, and no accessory muscle use.   Heart:  normal rate and regular rhythm.   Abdomen:  Bowel sounds positive,abdomen soft and non-tender without masses, organomegaly or   noted.  no flank pain Pulses:  nl cap refill  Extremities:  no clubbing cyanosis or edema  Neurologic:  grsossly non focal  Skin:  turgor normal, color normal, no ecchymoses, no petechiae, and no purpura.   Psych:  Oriented X3, good eye contact, not anxious appearing, and not depressed appearing.     Impression & Recommendations:  Problem # 1:  UTI (ICD-599.0)  seems  typical    retreat and get culture  .   Expectant  management and recheck as needed.  or when due for check  The following medications were removed from the medication list:    Ciprofloxacin Hcl 250 Mg Tabs (Ciprofloxacin hcl) ..... One by mouth two times a day for 7 days Her updated medication list for this problem includes:    Cipro 500 Mg Tabs (Ciprofloxacin hcl) .Marland Kitchen... 1 by mouth two times a day for uti  Orders: Prescription Created Electronically [phone])  Problem # 2:  URINALYSIS, ABNORMAL (ICD-791.9)  Orders: T-Culture, Urine  ([id number]) UA Dipstick w/o Micro (automated)  (81003)  Complete Medication List: 1)  Calcium 500/d 500-125 Mg-unit Tabs (Calcium carbonate-vitamin d) 2)  Flonase 50 Mcg/act Susp (Fluticasone propionate) .... 2 in each nostril daily 3)  Multi-vitamin Tabs (Multiple vitamin) 4)  Zyrtec Allergy 10 Mg Tabs (Cetirizine hcl) 5)  Ambien 10 Mg Tabs (Zolpidem tartrate) .Marland Kitchen.. 1 by mouth at bedtime 6)  Cipro 500 Mg Tabs (Ciprofloxacin hcl) .Marland Kitchen.. 1 by mouth two times a day for uti  Patient Instructions: 1)  take the antibiotic for UTI . 2)  will let your know  culture results  when availalbe usually 2-3 days .  3)  Avoid caffeine & carbonated drinks, they tend to irritate the bladder, Call  in 3-5 days if you're not better: sooner if you're feeling worse.    Prescriptions: CIPRO 500 MG TABS (CIPROFLOXACIN HCL) 1 by mouth two times a day for UTI  #10 x 0   Entered and Authorized by:   Madelin Headings MD   Signed by:   Madelin Headings MD on 12/22/2009   Method used:   Electronically to        [facility]* (retail)       [address] [place], [place]  [address]       Ph: [phone] or [phone]       Fax: [phone]   RxID:   [id number]   Laboratory Results   Urine Tests  Date/Time Received: December 22, 2009 1:39 PM   Routine Urinalysis   Color: yellow Appearance: Cloudy Glucose: negative   (Normal Range: Negative) Bilirubin: negative   (Normal Range: Negative) Ketone: negative   (Normal Range: Negative) Spec. Gravity: 1.020   (Normal Range: 1.003-1.035) Blood: trace-lysed   (Normal Range: Negative) pH: 7.5   (Normal Range: 5.0-8.0) Protein: negative   (Normal Range: Negative) Urobilinogen: 0.2   (Normal Range: 0-1) Nitrite: positive   (Normal Range: Negative) Leukocyte Esterace: large   (Normal Range: Negative)    Comments: Romualdo Bolk, CMA (AAMA)  December 22, 2009 1:40 PM

## 2010-12-17 NOTE — Assessment & Plan Note (Signed)
Summary: coughing and congestion x 10 days/ssc   Vital Signs:  Patient profile:   77 year old female Menstrual status:  hysterectomy Weight:      119 pounds Temp:     98.1 degrees F oral BP sitting:   130 / 70  (left arm) Cuff size:   regular  Vitals Entered By: Kern Reap CMA Duncan Dull) (November 19, 2010 2:16 PM) CC: chest cough x 10 days   CC:  chest cough x 10 days.  History of Present Illness: Duchess is a 77 year old female, nonsmoker, history of allergic rhinitis, but no asthma, who comes in today for evaluation of a cough x 10 days.  She said of fever, chills, earache, sore throat, etc.  The cough seems to be getting worse, and she has a sensation of tightening in her chest.  Review of systems negative  Allergies: No Known Drug Allergies  Past History:  Past medical, surgical, family and social histories (including risk factors) reviewed for relevance to current acute and chronic problems.  Past Medical History: Reviewed history from 05/27/2010 and no changes required. Allergic rhinitis Osteoarthritis neutropenia   with elevated MCV  no dx   Saw HEME in the past neg rheum  zieminski spinal stenosis Dexa 2007 mild osteopenia  -0.7 hip sp nl  Past Surgical History: Reviewed history from 05/27/2010 and no changes required. Hysterectomy  total Rotator cuff repair 1/08 right Colonscopy  Family History: Reviewed history from 11/06/2007 and no changes required. no change  Social History: Reviewed history from 04/22/2008 and no changes required. Retired Widowed    7/04      hh of 1  Never Smoked Alcohol use-yes  1 per day  Drug use-no Regular exercise-yes  Review of Systems      See HPI  Physical Exam  General:  Well-developed,well-nourished,in no acute distress; alert,appropriate and cooperative throughout examination Head:  Normocephalic and atraumatic without obvious abnormalities. No apparent alopecia or balding. Eyes:  No corneal or conjunctival  inflammation noted. EOMI. Perrla. Funduscopic exam benign, without hemorrhages, exudates or papilledema. Vision grossly normal. Ears:  External ear exam shows no significant lesions or deformities.  Otoscopic examination reveals clear canals, tympanic membranes are intact bilaterally without bulging, retraction, inflammation or discharge. Hearing is grossly normal bilaterally. Nose:  External nasal examination shows no deformity or inflammation. Nasal mucosa are pink and moist without lesions or exudates. Mouth:  Oral mucosa and oropharynx without lesions or exudates.  Teeth in good repair. Neck:  No deformities, masses, or tenderness noted. Chest Wall:  No deformities, masses, or tenderness noted. Lungs:  symmetrical breath sounds only mild expiratory wheezing   Problems:  Medical Problems Added: 1)  Dx of Asthma, Acute  (ZOX-096.04)  Impression & Recommendations:  Problem # 1:  ASTHMA, ACUTE (VWU-981.19) Assessment New  Her updated medication list for this problem includes:    Prednisone 20 Mg Tabs (Prednisone) ..... Uad  Complete Medication List: 1)  Calcium 500/d 500-125 Mg-unit Tabs (Calcium carbonate-vitamin d) 2)  Flonase 50 Mcg/act Susp (Fluticasone propionate) .... 2 in each nostril daily 3)  Multi-vitamin Tabs (Multiple vitamin) 4)  Zyrtec Allergy 10 Mg Tabs (Cetirizine hcl) 5)  Ambien 10 Mg Tabs (Zolpidem tartrate) .Marland Kitchen.. 1 by mouth at bedtime 6)  Prednisone 20 Mg Tabs (Prednisone) .... Uad 7)  Hydromet 5-1.5 Mg/67ml Syrp (Hydrocodone-homatropine) .... 1/2 tsp three times a day as needed cough  Patient Instructions: 1)  drank 20 ounces of water daily. 2)  Vaporizer in her bedroom at  night. 3)  Hydromet 0.5-teaspoon 3 times a day as needed for cough. 4)  Prednisone two caps x 3 days, one x 3 days, a half x 3 days, then half a tablet Monday, Wednesday, Friday, for a two-week taper. 5)  Return p.r.n.Marland Kitchen 6)  Stop the Mucinex Prescriptions: HYDROMET 5-1.5 MG/5ML SYRP  (HYDROCODONE-HOMATROPINE) 1/2 tsp three times a day as needed cough  #8oz x 0   Entered and Authorized by:   Roderick Pee MD   Signed by:   Roderick Pee MD on 11/19/2010   Method used:   Print then Give to Patient   RxID:   [id number] PREDNISONE 20 MG TABS (PREDNISONE) UAD  #30 x 1   Entered and Authorized by:   Roderick Pee MD   Signed by:   Roderick Pee MD on 11/19/2010   Method used:   Electronically to        [facility]* (retail)       [address] [place], [place]  [address]       Ph: [phone] or [phone]       Fax: [phone]   RxID:   [id number]    Orders Added: 1)  Est. Patient Level III [18841]

## 2010-12-17 NOTE — Assessment & Plan Note (Signed)
Summary: not feeling well/ssc   Vital Signs:  Patient profile:   77 year old female Menstrual status:  hysterectomy Weight:      117 pounds Temp:     98.4 degrees F oral Pulse rate:   66 / minute BP sitting:   120 / 70  (right arm) Cuff size:   regular  Vitals Entered By: Romualdo Bolk, CMA (AAMA) (December 03, 2010 8:47 AM) CC: Not feeling good. Pt would like to have labs done. She still has stuff in her throat. Pt is concerned about it may being depressed. She is not having the engery to do anything. Occ. Dizziness   History of Present Illness: Kelly Wolfe comes in today  for  above.  Just doesnt feel well. NOn specifi. No cp sob  cough and wheeze is better but still has some  signs  about 80% better  NO fever NVD . no falling or neuro signs .Had  had Cataract surgery and then rti.    prednisone  given to her ofr rti  made her feel strange dizzy and  off.  now off meds.    Stopeed the pred  tool some cough med.    No pain weight loss or sweats or change in appetite preceding this. May be a bit depressed but had been doing fairly well until recently.   Preventive Screening-Counseling & Management  Alcohol-Tobacco     Alcohol drinks/day: 1     Alcohol type: wine and scotch     Smoking Status: never     Tobacco Counseling: not indicated; no tobacco use  Caffeine-Diet-Exercise     Caffeine use/day: 2     Does Patient Exercise: yes     Type of exercise: walking and areobics     Times/week: 3     MSH Depression Score: see hx   Current Medications (verified): 1)  Calcium 500/d 500-125 Mg-Unit  Tabs (Calcium Carbonate-Vitamin D) 2)  Flonase 50 Mcg/act  Susp (Fluticasone Propionate) .... 2 in Each Nostril Daily 3)  Multi-Vitamin   Tabs (Multiple Vitamin) 4)  Zyrtec Allergy 10 Mg  Tabs (Cetirizine Hcl) 5)  Ambien 10 Mg  Tabs (Zolpidem Tartrate) .Marland Kitchen.. 1 By Mouth At Bedtime  Allergies (verified): 1)  Prednisone  Past History:  Past medical, surgical, family and social  histories (including risk factors) reviewed, and no changes noted (except as noted below).  Past Medical History: Reviewed history from 05/27/2010 and no changes required. Allergic rhinitis Osteoarthritis neutropenia   with elevated MCV  no dx   Saw HEME in the past neg rheum  zieminski spinal stenosis Dexa 2007 mild osteopenia  -0.7 hip sp nl  Past Surgical History: Reviewed history from 05/27/2010 and no changes required. Hysterectomy  total Rotator cuff repair 1/08 right Colonscopy  Past History:  Care Management: Dermatology: Emily Filbert Orthopedics: [facility] Has Heme consult in the past rheum consiutl in past   Family History: Reviewed history from 11/06/2007 and no changes required. no change  Social History: Reviewed history from 04/22/2008 and no changes required. Retired Widowed    7/04      hh of 1  Never Smoked Alcohol use-yes  1 per day  Drug use-no Regular exercise-yes  Review of Systems  The patient denies anorexia, fever, weight loss, weight gain, vision loss, decreased hearing, hoarseness, chest pain, syncope, dyspnea on exertion, peripheral edema, abdominal pain, melena, hematochezia, severe indigestion/heartburn, hematuria, muscle weakness, transient blindness, difficulty walking, unusual weight change, abnormal bleeding, enlarged lymph nodes, and angioedema.  Physical Exam  General:  Well-developed,well-nourished,in no acute distress; alert,appropriate and cooperative throughout examination Head:  normocephalic and atraumatic.   Eyes:  vision grossly intact, pupils equal, pupils round, and pupils reactive to light.   Ears:  R ear normal, L ear normal, and no external deformities.  some  wax   Nose:  no external deformity and no nasal discharge.   Mouth:  pharynx pink and moist.   Neck:  No deformities, masses, or tenderness noted. Lungs:  Normal respiratory effort, chest expands symmetrically. Lungs are clear to auscultation, no crackles or wheezes. Heart:   Normal rate and regular rhythm. S1 and S2 normal without gallop, murmur, click, rub or other extra sounds. Abdomen:  Bowel sounds positive,abdomen soft and non-tender without masses, organomegaly or hernias noted. no bruits  Msk:  no joint swelling, no joint warmth, and no redness over joints.   Pulses:  pulses intact without delay   Extremities:  no clubbing cyanosis or edema  Neurologic:  alert & oriented X3, cranial nerves II-XII intact, and gait normal.  grosssly non focal  Skin:  turgor normal, color normal, no ecchymoses, and no petechiae.  sun changes  Cervical Nodes:  No lymphadenopathy noted Psych:  Oriented X3, normally interactive, good eye contact, and not anxious appearing.  midly subdued.   Impression & Recommendations:  Problem # 1:  MALAISE AND FATIGUE (ICD-780.79) prob se of recent rti and pred se  but  r/o metabolic.  consider depressive reaction also.  Orders: TLB-TSH (Thyroid Stimulating Hormone) (84443-TSH) TLB-Hepatic/Liver Function Pnl (80076-HEPATIC) TLB-BMP (Basic Metabolic Panel-BMET) (80048-METABOL) UA Dipstick w/o Micro (automated)  (81003) Sedimentation Rate, non-automated (16109) Specimen Handling (60454) Venipuncture (09811) TLB-Sedimentation Rate (ESR) (85652-ESR)  Problem # 2:  ASTHMA, ACUTE (ICD-493.92) Assessment: Improved no wheezing and nl lung exam  The following medications were removed from the medication list:    Prednisone 20 Mg Tabs (Prednisone) ..... Uad  Problem # 3:  ADVERSE REACTION TO MEDICATION (ICD-995.29) prednisone.   Complete Medication List: 1)  Calcium 500/d 500-125 Mg-unit Tabs (Calcium carbonate-vitamin d) 2)  Flonase 50 Mcg/act Susp (Fluticasone propionate) .... 2 in each nostril daily 3)  Multi-vitamin Tabs (Multiple vitamin) 4)  Zyrtec Allergy 10 Mg Tabs (Cetirizine hcl) 5)  Ambien 10 Mg Tabs (Zolpidem tartrate) .Marland Kitchen.. 1 by mouth at bedtime  Other Orders: TLB-CBC Platelet - w/Differential (85025-CBCD) TLB-Lipid Panel  (80061-LIPID)  Patient Instructions: 1)  You will be informed of lab results when available.  2)  then plan follow up  3)  if chest not continuing to improve or still feeling fatigue  in another week or so  we should get a chest x ray.    Orders Added: 1)  TLB-TSH (Thyroid Stimulating Hormone) [84443-TSH] 2)  TLB-Hepatic/Liver Function Pnl [80076-HEPATIC] 3)  TLB-CBC Platelet - w/Differential [85025-CBCD] 4)  TLB-BMP (Basic Metabolic Panel-BMET) [80048-METABOL] 5)  TLB-Lipid Panel [80061-LIPID] 6)  UA Dipstick w/o Micro (automated)  [81003] 7)  Sedimentation Rate, non-automated [85651] 8)  Specimen Handling [99000] 9)  Venipuncture [36415] 10)  TLB-Sedimentation Rate (ESR) [85652-ESR] 11)  Est. Patient Level IV [91478]    Laboratory Results   Urine Tests    Routine Urinalysis   Color: yellow Appearance: Clear Glucose: negative   (Normal Range: Negative) Bilirubin: negative   (Normal Range: Negative) Ketone: negative   (Normal Range: Negative) Spec. Gravity: 1.020   (Normal Range: 1.003-1.035) Blood: negative   (Normal Range: Negative) pH: 6.0   (Normal Range: 5.0-8.0) Protein: negative   (Normal Range: Negative)  Urobilinogen: 0.2   (Normal Range: 0-1) Nitrite: negative   (Normal Range: Negative) Leukocyte Esterace: 1+   (Normal Range: Negative)    Comments: Rita Ohara  December 03, 2010 10:11 AM

## 2010-12-17 NOTE — Progress Notes (Signed)
Summary: Lab orders  Phone Note Call from Patient Call back at Home Phone [phone]   Caller: Patient Summary of Call: Pt wants to know what labs she needs to have done and if she needs a cpx or follow up on labs? Initial call taken by: Romualdo Bolk, CMA (AAMA),  November 24, 2010 11:31 AM  Follow-up for Phone Call        see ov

## 2010-12-17 NOTE — Progress Notes (Signed)
Summary: Pt is sob and dizzy  Phone Note Outgoing Call   Call placed by: Romualdo Bolk, CMA (AAMA),  November 30, 2010 9:45 AM Call placed to: Patient Summary of Call: I called pt to get more info on why she was coming in. Pt states that she is still not feeling well from ov with Dr. Tawanna Cooler. Pt states that the congestion is gone but still has some SOB and dizziness. No other symptoms. Pt seems to be talking fine. She also stated that the prednisone made her feel weird. She d/c it on 1/9 when she was suppose to taper off it. Pt states that the prednisone made her face red, had nausea and couldn't sleep. Initial call taken by: Romualdo Bolk, CMA (AAMA),  November 30, 2010 9:48 AM

## 2010-12-23 NOTE — Assessment & Plan Note (Signed)
Summary: follow up on labs and x-ray/ssc   Vital Signs:  Patient profile:   77 year old female Menstrual status:  hysterectomy Weight:      116 pounds Temp:     98.4 degrees F oral Pulse rate:   78 / minute BP sitting:   100 / 60  (right arm) Cuff size:   regular  Vitals Entered By: Romualdo Bolk, CMA (AAMA) (December 15, 2010 3:11 PM) CC: Follow-up visit on labs and x-ray. Pt is also having sinus pressure and congestion that has been going on for 1 week. No fever, some coughing.   History of Present Illness: [employer] . comes in today  for follow up of labs and  also sinus  problem, Since last visit  here  there have been no major changes in health status    and is feeling better except for  left face tenderness and increasing nasal discharge congestion.  no meds for this. No fever cp or sob  Allergies  stable   no sneezing or itching  Back still has some discomfort  .   malaise and fatigue is getting better  Preventive Screening-Counseling & Management  Alcohol-Tobacco     Alcohol drinks/day: 1     Alcohol type: wine and scotch     Smoking Status: never     Tobacco Counseling: not indicated; no tobacco use  Caffeine-Diet-Exercise     Caffeine use/day: 2     Does Patient Exercise: yes     Type of exercise: walking and areobics     Times/week: 3     MSH Depression Score: see hx   Current Medications (verified): 1)  Calcium 500/d 500-125 Mg-Unit  Tabs (Calcium Carbonate-Vitamin D) 2)  Flonase 50 Mcg/act  Susp (Fluticasone Propionate) .... 2 in Each Nostril Daily 3)  Multi-Vitamin   Tabs (Multiple Vitamin) 4)  Zyrtec Allergy 10 Mg  Tabs (Cetirizine Hcl) 5)  Ambien 10 Mg  Tabs (Zolpidem Tartrate) .Marland Kitchen.. 1 By Mouth At Bedtime  Allergies (verified): 1)  Prednisone  Past History:  Past medical, surgical, family and social histories (including risk factors) reviewed for relevance to current acute and chronic problems.  Past Medical History: Reviewed history from  05/27/2010 and no changes required. Allergic rhinitis Osteoarthritis neutropenia   with elevated MCV  no dx   Saw HEME in the past neg rheum  zieminski spinal stenosis Dexa 2007 mild osteopenia  -0.7 hip sp nl  Past Surgical History: Reviewed history from 05/27/2010 and no changes required. Hysterectomy  total Rotator cuff repair 1/08 right Colonscopy  Past History:  Care Management: Dermatology: Emily Filbert Orthopedics: [facility] Has Heme consult in the past rheum consiutl in past   Family History: Reviewed history from 11/06/2007 and no changes required. no change  Social History: Reviewed history from 04/22/2008 and no changes required. Retired Widowed    7/04      hh of 1  Never Smoked Alcohol use-yes  1 per day  Drug use-no Regular exercise-yes  Review of Systems  The patient denies anorexia, fever, weight loss, weight gain, vision loss, decreased hearing, chest pain, prolonged cough, abdominal pain, melena, hematochezia, severe indigestion/heartburn, hematuria, transient blindness, difficulty walking, abnormal bleeding, enlarged lymph nodes, and angioedema.    Physical Exam  General:  Well-developed,well-nourished,in no acute distress; alert,appropriate and cooperative throughout examination Head:  normocephalic and atraumatic.   Eyes:  vision grossly intact.   Ears:  R ear normal, L ear normal, and no external deformities.   Nose:  left nostril  with discharge   tendernleft maxilla  more than right Mouth:  pharynx pink and moist.   Neck:  No deformities, masses, or tenderness noted. Lungs:  Normal respiratory effort, chest expands symmetrically. Lungs are clear to auscultation, no crackles or wheezes. Heart:  Normal rate and regular rhythm. S1 and S2 normal without gallop, murmur, click, rub or other extra sounds. Msk:  oa changes  Pulses:  pulses intact without delay   Skin:  turgor normal, color normal, no ecchymoses, and no petechiae.   Cervical Nodes:  No  lymphadenopathy noted Psych:  Oriented X3, good eye contact, not anxious appearing, and not depressed appearing.     Impression & Recommendations:  Problem # 1:  SINUSITIS - ACUTE-NOS (ICD-461.9) Assessment New  left maxillary       Her updated medication list for this problem includes:    Flonase 50 Mcg/act Susp (Fluticasone propionate) .Marland Kitchen... 2 in each nostril daily    Amoxicillin 500 Mg Tabs (Amoxicillin) .Marland Kitchen... 1 three times a day for sinusitis  Instructed on treatment. Call if symptoms persist or worsen.   Problem # 2:  MALAISE AND FATIGUE (ICD-780.79) Assessment: Improved and   cough  wheeze much better   labs look stable    Problem # 3:  NEUTROPENIA NOS (ICD-288.00) sstable   Problem # 4:  SPINAL STENOSIS (ICD-724.00) Assessment: Comment Only  Problem # 5:  ALLERGIC RHINITIS (ICD-477.9)  Her updated medication list for this problem includes:    Flonase 50 Mcg/act Susp (Fluticasone propionate) .Marland Kitchen... 2 in each nostril daily    Zyrtec Allergy 10 Mg Tabs (Cetirizine hcl)  Complete Medication List: 1)  Calcium 500/d 500-125 Mg-unit Tabs (Calcium carbonate-vitamin d) 2)  Flonase 50 Mcg/act Susp (Fluticasone propionate) .... 2 in each nostril daily 3)  Multi-vitamin Tabs (Multiple vitamin) 4)  Zyrtec Allergy 10 Mg Tabs (Cetirizine hcl) 5)  Ambien 10 Mg Tabs (Zolpidem tartrate) .Marland Kitchen.. 1 by mouth at bedtime 6)  Amoxicillin 500 Mg Tabs (Amoxicillin) .Marland Kitchen.. 1 three times a day for sinusitis labs reviewed with patient  Patient Instructions: 1)  labs are good.    2)  treat for sinsusitis  3)  expect improvement in the next 3-5. day  call if not   4)  continue saline washes . 5)  plan  for check up  cpx next january  or as needed.  Prescriptions: AMOXICILLIN 500 MG TABS (AMOXICILLIN) 1 three times a day for sinusitis  #30 x 0   Entered and Authorized by:   Madelin Headings MD   Signed by:   Madelin Headings MD on 12/15/2010   Method used:   Electronically to        [facility]* (retail)       [address] [place], [place]  [address]       Ph: [phone] or [phone]       Fax: [phone]   RxID:   [id number]    Orders Added: 1)  Est. Patient Level IV [60630]

## 2011-03-04 ENCOUNTER — Other Ambulatory Visit: Payer: Self-pay | Admitting: Internal Medicine

## 2011-03-04 DIAGNOSIS — Z1231 Encounter for screening mammogram for malignant neoplasm of breast: Secondary | ICD-10-CM

## 2011-03-30 ENCOUNTER — Encounter: Payer: Self-pay | Admitting: Internal Medicine

## 2011-03-30 DIAGNOSIS — D709 Neutropenia, unspecified: Secondary | ICD-10-CM | POA: Insufficient documentation

## 2011-03-30 DIAGNOSIS — M8580 Other specified disorders of bone density and structure, unspecified site: Secondary | ICD-10-CM | POA: Insufficient documentation

## 2011-03-31 ENCOUNTER — Encounter: Payer: Self-pay | Admitting: Internal Medicine

## 2011-03-31 ENCOUNTER — Ambulatory Visit (INDEPENDENT_AMBULATORY_CARE_PROVIDER_SITE_OTHER): Payer: Medicare Other | Admitting: Internal Medicine

## 2011-03-31 VITALS — BP 120/80 | HR 78 | Temp 98.6°F | Wt 119.0 lb

## 2011-03-31 DIAGNOSIS — H9311 Tinnitus, right ear: Secondary | ICD-10-CM

## 2011-03-31 DIAGNOSIS — R42 Dizziness and giddiness: Secondary | ICD-10-CM

## 2011-03-31 DIAGNOSIS — J329 Chronic sinusitis, unspecified: Secondary | ICD-10-CM

## 2011-03-31 DIAGNOSIS — H9190 Unspecified hearing loss, unspecified ear: Secondary | ICD-10-CM | POA: Insufficient documentation

## 2011-03-31 DIAGNOSIS — H9319 Tinnitus, unspecified ear: Secondary | ICD-10-CM

## 2011-03-31 DIAGNOSIS — H918X9 Other specified hearing loss, unspecified ear: Secondary | ICD-10-CM

## 2011-03-31 DIAGNOSIS — H918X3 Other specified hearing loss, bilateral: Secondary | ICD-10-CM

## 2011-03-31 MED ORDER — AMOXICILLIN 500 MG PO CAPS: 500.0000 mg | ORAL_CAPSULE | Freq: Three times a day (TID) | ORAL | Status: AC

## 2011-03-31 NOTE — Progress Notes (Signed)
Subjective:    Patient ID: Kelly Wolfe, female    DOB: 08-17-1931, 77 y.o.   MRN: [id number]  HPI Patient comes in today for an acute visit for the above problem. About 2 weeks ago she had the onset of right intermittent ear ringing.  She went to an urgent care and   Had wax flushed out and ? No better No pain t still a problem . Since then has felt terrible with dizzy and nause just feels bad and noted dizziness like she was going to  Felt bad . Like she was going to pass out and fall   while mowing the lawn.   NO cp sob some nasal congestion and took some leftover medication for sinus problems  .  She also has taken her Allergy  Pill   (gen zyrtec  ) took a few med used in past for sinusitis ? Antibiotic for   3 days  And some improved .  She states that a number of years ago she had bilateral ringing in her ears that was different and that went to the ear nose and throat doctor and they told her there was nothing different to do. She used some type of over-the-counter ear drop and everything went away. At that time there was no associated dizziness. Review of Systems Negative for chest pain shortness of breath change in vision numbness weakness falling. No fever. She does have some nasal congestion.  Past Medical History  Diagnosis Date  . Allergic rhinitis   . Osteoarthritis   . Neutropenia     with elevated MCV no dx saw heme in the past neg rheum zieminski  . Spinal stenosis   . Osteopenia     mild dexa 2007 -0.7 hip sp nl   Past Surgical History  Procedure Date  . Abdominal hysterectomy     total  . Rotator cuff repair 01/08    right    reports that she has never smoked. She does not have any smokeless tobacco history on file. She reports that she drinks about 4.2 ounces of alcohol per week. She reports that she does not use illicit drugs. family history is not on file. Allergies  Allergen Reactions  . Prednisone     REACTION: nausea, shaky, unsteady, decreased  appitite       Objective:   Physical Exam wdwn in nad  HEENT: Normocephalic ;atraumatic , Eyes;  PERRL, EOMs  Full, lids and conjunctiva clear,,Ears: no deformities, canals nl, TM landmarks normal, Nose: no deformity or discharge  Mouth : OP clear without lesion or edema . Neck no bruit or masses  Chest:  Clear to A&P without wheezes rales or rhonchi CV:  S1-S2 no gallops or murmurs peripheral perfusion is normal Abdomen:  Sof,t normal bowel sounds without hepatosplenomegaly, no guarding rebound or masses no CVA tenderness Neuro 3-12 cn nl    Intact  DTRS + gait and station and rhoimberg nl .    No focal weakness  No current dizziness Ext oa changes otherwise nl  Last labs nl in January.    See Hearing test  Sig discrepancy  In right vs left ear  Right 25 -30 db  Left 45- 50 db loss    Assessment & Plan:  Right sided tinnitus with some dizziness somewhat  Vague . NOn focal neuro exam. No associated symptoms except felt nauseous but no headache or neurologic changes. Allergic rhinits Decrease hearing   Curious  assymmetry  Hearing on our  screen .      Remote history of bilateral tinnitus that resolved.  This appears to be a bit different however she did take some leftover antibiotic with some slight improvement but only took it for 3 days. She should continue on her allergy medicine and add a full course of antibiotic. If not getting better we may need ENT doctors again to reevaluate question possibility of Mnire's disease.

## 2011-03-31 NOTE — Patient Instructions (Signed)
Tale med for sinusitis continue allergy medication and nose spary every day  Will contact you about consult appt.

## 2011-04-05 ENCOUNTER — Ambulatory Visit
Admission: RE | Admit: 2011-04-05 | Discharge: 2011-04-05 | Disposition: A | Discharge status: Home / Self Care | Payer: Medicare Other | Source: Ambulatory Visit | Attending: Internal Medicine | Admitting: Internal Medicine

## 2011-04-05 DIAGNOSIS — Z1231 Encounter for screening mammogram for malignant neoplasm of breast: Secondary | ICD-10-CM

## 2011-04-07 ENCOUNTER — Other Ambulatory Visit: Payer: Self-pay | Admitting: Internal Medicine

## 2011-04-07 DIAGNOSIS — N6320 Unspecified lump in the left breast, unspecified quadrant: Secondary | ICD-10-CM

## 2011-04-15 ENCOUNTER — Other Ambulatory Visit: Payer: Medicare Other

## 2011-04-20 ENCOUNTER — Ambulatory Visit
Admission: RE | Admit: 2011-04-20 | Discharge: 2011-04-20 | Disposition: A | Discharge status: Home / Self Care | Payer: Medicare Other | Source: Ambulatory Visit | Attending: Internal Medicine | Admitting: Internal Medicine

## 2011-04-20 DIAGNOSIS — N6320 Unspecified lump in the left breast, unspecified quadrant: Secondary | ICD-10-CM

## 2011-07-01 ENCOUNTER — Telehealth: Payer: Self-pay | Admitting: *Deleted

## 2011-07-01 ENCOUNTER — Ambulatory Visit (INDEPENDENT_AMBULATORY_CARE_PROVIDER_SITE_OTHER): Payer: PRIVATE HEALTH INSURANCE | Admitting: Internal Medicine

## 2011-07-01 ENCOUNTER — Encounter: Payer: Self-pay | Admitting: Internal Medicine

## 2011-07-01 VITALS — BP 140/80 | HR 60 | Temp 98.2°F | Wt 117.0 lb

## 2011-07-01 DIAGNOSIS — H9190 Unspecified hearing loss, unspecified ear: Secondary | ICD-10-CM

## 2011-07-01 DIAGNOSIS — H9319 Tinnitus, unspecified ear: Secondary | ICD-10-CM

## 2011-07-01 DIAGNOSIS — F329 Major depressive disorder, single episode, unspecified: Secondary | ICD-10-CM | POA: Insufficient documentation

## 2011-07-01 DIAGNOSIS — F341 Dysthymic disorder: Secondary | ICD-10-CM

## 2011-07-01 DIAGNOSIS — H9311 Tinnitus, right ear: Secondary | ICD-10-CM

## 2011-07-01 DIAGNOSIS — G4700 Insomnia, unspecified: Secondary | ICD-10-CM

## 2011-07-01 DIAGNOSIS — H918X9 Other specified hearing loss, unspecified ear: Secondary | ICD-10-CM

## 2011-07-01 MED ORDER — ESCITALOPRAM OXALATE 10 MG PO TABS: ORAL_TABLET | ORAL | Status: DC

## 2011-07-01 NOTE — Telephone Encounter (Signed)
Change to sertraline 50 mg" take 1/2 po qd for 2-3 weeks then 1 po qd or as directed ." Disp :30  Refill x 2  Keep follow up visit as we discussed

## 2011-07-01 NOTE — Progress Notes (Signed)
  Subjective:    Patient ID: Berenice Primas, female    DOB: Jun 19, 1931, 77 y.o.   MRN: [id number]  HPI Comes  In today for acute visit for  continued problem with tininitus becoming more problematic.  Has seen Dr Annalee Genta  And to follow up  In a few months . ? Depression about this.  Decreased interest, appetite and sleep. ? What tot do . Hx of some  ssri use when husband died otherwise no MH hx.  , No falling vision change . Had initial visit with [facility]  And would need a referral to go forward  No sig etoh used  Small dose of ambien prn and some help no se. Unsure what else to do . Tinnitus keeping here awake  Review of Systems Neg cp sob fever vision change.   Past history family history social history reviewed in the electronic medical record.     Objective:   Physical Exam WDWN in nad   Good eye contact mildly distressed Oriented x 3 and no noted deficits in memory, attention, and speech. CV nl pulses s1 s2 no gallop or murmur  No clubbing cyanosis or edema Oa changes   Neuro nl gross motor gait  Steady.     Assessment & Plan:  Unilateral tinnitus  And hearing loss.    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   At this point avoiding benzos  Although could help temporarily.   Plan referral . Total visit > 50% spent counseling and coordinating care

## 2011-07-01 NOTE — Telephone Encounter (Signed)
Pt states that medication given today cost $90.00 and she can't afford it. Pt would like something cheaper.

## 2011-07-01 NOTE — Patient Instructions (Signed)
Take med as directed  Will do referral to hearing center. Keep f/u appt with ENT. Plan follow up in 3-4 weeks or as needed.

## 2011-07-02 MED ORDER — SERTRALINE HCL 50 MG PO TABS: ORAL_TABLET | ORAL | Status: DC

## 2011-07-02 NOTE — Telephone Encounter (Signed)
Rx sent to pharmacy Pt aware of this. 

## 2011-08-05 ENCOUNTER — Telehealth: Payer: Self-pay | Admitting: *Deleted

## 2011-08-05 MED ORDER — FLUTICASONE PROPIONATE 50 MCG/ACT NA SUSP: 2.0000 | Freq: Every day | NASAL | Status: DC

## 2011-08-05 NOTE — Telephone Encounter (Signed)
Rx sent to pharmacy   

## 2011-09-15 ENCOUNTER — Other Ambulatory Visit: Payer: Self-pay | Admitting: *Deleted

## 2011-09-15 NOTE — Telephone Encounter (Signed)
Pt called saying that pharmacy sent [place] a refill request on her sleeping medication. We never received a refill request for this medication. I have left pt a message to call back about this.

## 2011-09-17 ENCOUNTER — Other Ambulatory Visit: Payer: Self-pay | Admitting: *Deleted

## 2011-09-17 MED ORDER — ZOLPIDEM TARTRATE 10 MG PO TABS: 10.0000 mg | ORAL_TABLET | Freq: Every evening | ORAL | Status: DC | PRN

## 2011-09-17 NOTE — Telephone Encounter (Signed)
Per Dr. Fabian Sharp- ok x 1 Rx faxed to pharmacy

## 2011-09-17 NOTE — Telephone Encounter (Signed)
Refill on zolpidem 10mg  last filled on 10/20/10

## 2011-09-20 ENCOUNTER — Telehealth: Payer: Self-pay

## 2011-09-20 NOTE — Telephone Encounter (Signed)
Called [facility] and was told that pt's ambien 10 mg had been received.

## 2011-09-20 NOTE — Telephone Encounter (Signed)
This i think was already sent in 11 2

## 2011-09-22 NOTE — Telephone Encounter (Signed)
Pt aware of this. 

## 2011-09-24 ENCOUNTER — Telehealth: Payer: Self-pay | Admitting: *Deleted

## 2011-09-24 MED ORDER — FLUTICASONE PROPIONATE 50 MCG/ACT NA SUSP: 2.0000 | Freq: Every day | NASAL | Status: DC

## 2011-09-24 NOTE — Telephone Encounter (Signed)
Refill on flonase.

## 2011-12-03 ENCOUNTER — Ambulatory Visit: Payer: Self-pay | Admitting: Internal Medicine

## 2011-12-08 ENCOUNTER — Ambulatory Visit (INDEPENDENT_AMBULATORY_CARE_PROVIDER_SITE_OTHER): Payer: Medicare Other | Admitting: Internal Medicine

## 2011-12-08 ENCOUNTER — Encounter: Payer: Self-pay | Admitting: Internal Medicine

## 2011-12-08 VITALS — BP 106/62 | HR 70 | Ht 62.25 in | Wt 114.0 lb

## 2011-12-08 DIAGNOSIS — J329 Chronic sinusitis, unspecified: Secondary | ICD-10-CM

## 2011-12-08 DIAGNOSIS — J349 Unspecified disorder of nose and nasal sinuses: Secondary | ICD-10-CM

## 2011-12-08 DIAGNOSIS — E785 Hyperlipidemia, unspecified: Secondary | ICD-10-CM

## 2011-12-08 DIAGNOSIS — G4700 Insomnia, unspecified: Secondary | ICD-10-CM

## 2011-12-08 DIAGNOSIS — H9319 Tinnitus, unspecified ear: Secondary | ICD-10-CM

## 2011-12-08 DIAGNOSIS — Z0000 Encounter for general adult medical examination without abnormal findings: Secondary | ICD-10-CM | POA: Insufficient documentation

## 2011-12-08 DIAGNOSIS — H918X9 Other specified hearing loss, unspecified ear: Secondary | ICD-10-CM

## 2011-12-08 DIAGNOSIS — M899 Disorder of bone, unspecified: Secondary | ICD-10-CM

## 2011-12-08 DIAGNOSIS — H6120 Impacted cerumen, unspecified ear: Secondary | ICD-10-CM

## 2011-12-08 DIAGNOSIS — H9311 Tinnitus, right ear: Secondary | ICD-10-CM

## 2011-12-08 DIAGNOSIS — J309 Allergic rhinitis, unspecified: Secondary | ICD-10-CM

## 2011-12-08 DIAGNOSIS — M1990 Unspecified osteoarthritis, unspecified site: Secondary | ICD-10-CM

## 2011-12-08 DIAGNOSIS — D709 Neutropenia, unspecified: Secondary | ICD-10-CM

## 2011-12-08 DIAGNOSIS — M949 Disorder of cartilage, unspecified: Secondary | ICD-10-CM

## 2011-12-08 LAB — BASIC METABOLIC PANEL
Calcium: 9.5 mg/dL (ref 8.4–10.5)
Creatinine, Ser: 0.9 mg/dL (ref 0.4–1.2)
GFR: 62.61 mL/min (ref >60.00)
Sodium: 143 mEq/L (ref 135–145)

## 2011-12-08 LAB — CBC WITH DIFFERENTIAL/PLATELET
Basophils Absolute: 0 10*3/uL (ref 0.0–0.1)
Basophils Relative: 0.5 % (ref 0.0–3.0)
Eosinophils Absolute: 0.1 10*3/uL (ref 0.0–0.7)
Hemoglobin: 14.4 g/dL (ref 12.0–15.0)
Lymphocytes Relative: 23.7 % (ref 12.0–46.0)
Monocytes Relative: 7.1 % (ref 3.0–12.0)
Neutro Abs: 2.9 10*3/uL (ref 1.4–7.7)
Neutrophils Relative %: 65.5 % (ref 43.0–77.0)
RBC: 4.07 Mil/uL (ref 3.87–5.11)
RDW: 13.1 % (ref 11.5–14.6)

## 2011-12-08 LAB — HEPATIC FUNCTION PANEL
AST: 16 U/L (ref 0–37)
Albumin: 4.6 g/dL (ref 3.5–5.2)
Alkaline Phosphatase: 59 U/L (ref 39–117)
Bilirubin, Direct: 0 mg/dL (ref 0.0–0.3)

## 2011-12-08 LAB — TSH: TSH: 1.04 u[IU]/mL (ref 0.35–5.50)

## 2011-12-08 LAB — LIPID PANEL
HDL: 72.8 mg/dL (ref >39.00)
Total CHOL/HDL Ratio: 3
VLDL: 19 mg/dL (ref 0.0–40.0)

## 2011-12-08 MED ORDER — ZOLPIDEM TARTRATE 10 MG PO TABS: 5.0000 mg | ORAL_TABLET | Freq: Every evening | ORAL | Status: DC | PRN

## 2011-12-08 MED ORDER — AMOXICILLIN-POT CLAVULANATE 875-125 MG PO TABS: 1.0000 | ORAL_TABLET | Freq: Two times a day (BID) | ORAL | Status: AC

## 2011-12-08 NOTE — Progress Notes (Signed)
Subjective:    Patient ID: Kelly Wolfe, female    DOB: 1931-10-15, 78 y.o.   MRN: [id number]  HPI Patient comes in today for preventive visit and follow-up of medical issues. Update of her history since her last visit. Since last visit she has had new problem flare :   Sinus  Problems  With inc congestion and now left side headache. Over maxilla    Bothering her since for a month.  Using  flonase  Every day.  And allerteck.  Neck is sqishy   No pain or radiation to arms  . No change in her OA otherwise .  Sleep needs Remus Loffler denies se and only takin 5 mg anyway  Want refill aware of se   Tinnitus in right ear a bit worse ? Was in there   Hearing:  As above some decrease no change  Vision:  No limitations at present . Blima Ledger  Safety:  Has smoke detector and wears seat belts.  No firearms. No excess sun exposure. Sees dentist regularly.  Falls: no does reg exercise every am 15 minutes   Advance directive :  Reviewed   Memory: Felt to be good  , no concern from her or her family.  Depression: No anhedonia unusual crying or depressive symptoms sometimes sad in winter  And blah   Nutrition: Eats well balanced diet; adequate calcium and vitamin D. No swallowing chewiing problems.  Injury: no major injuries in the last six months.  Other healthcare providers:  Reviewed today .  Social:  Widowed lives a lone and independent  No pets  Active in community like to read  Preventive parameters: up-to-date on colonoscopy, mammogram, immunizations. Per patient   ADLS:   There are no problems or need for assistance  driving, feeding, obtaining food, dressing, toileting and bathing, managing money using phone. She is independent.   Review of Systems ROS:  GEN/ HEENTNo fever, significant weight changes sweats headaches vision problems CV/ PULM; No chest pain shortness of breath cough, syncope,edema  change in exercise tolerance. GI /GU: No adominal pain, vomiting, change in  bowel habits. No blood in the stool. No significant GU symptoms. SKIN/HEME: ,no acute skin rashes suspicious lesions or bleeding. No lymphadenopathy, nodules, masses.   Has had bx done per derm neg recently NEURO/ PSYCH:  No neurologic signs such as weakness numbness No increasedepression anxiety. IMM/ Allergy: No unusual infections.  Allergy .   REST of 12 system review negative rest as per hpi   Past Medical History  Diagnosis Date  . Allergic rhinitis   . Osteoarthritis   . Neutropenia     with elevated MCV no dx saw heme in the past neg rheum zieminski  . Spinal stenosis   . Osteopenia     mild dexa 2007 -0.7 hip sp nl    History   Social History  . Marital Status: Married    Spouse Name: N/A    Number of Children: N/A  . Years of Education: N/A   Occupational History  . Not on file.   Social History Main Topics  . Smoking status: Never Smoker   . Smokeless tobacco: Not on file  . Alcohol Use: 4.2 oz/week    7 Glasses of wine per week  . Drug Use: No  . Sexually Active:    Other Topics Concern  . Not on file   Social History Narrative   RetiredWidowed 7/04 HH of 1Regular exercise- yes    Past Surgical  History  Procedure Date  . Abdominal hysterectomy     total  . Rotator cuff repair 01/08    right    No family history on file.  Allergies  Allergen Reactions  . Prednisone     REACTION: nausea, shaky, unsteady, decreased appitite    Current Outpatient Prescriptions on File Prior to Visit  Medication Sig Dispense Refill  . calcium-vitamin D (OSCAL WITH D) 500-200 MG-UNIT per tablet Take 1 tablet by mouth daily.        . cetirizine (ZYRTEC) 10 MG tablet Take 10 mg by mouth daily.        . fluticasone (FLONASE) 50 MCG/ACT nasal spray Place 2 sprays into the nose daily.  16 g  3  . MULTIPLE VITAMIN PO Take by mouth.          BP 106/62  Pulse 70  Ht 5' 2.25" (1.581 m)  Wt 114 lb (51.71 kg)  BMI 20.68 kg/m2  SpO2 96%        Objective:    Physical Exam Physical Exam: Vital signs reviewed ZOX:WRUE is a well-developed well-nourished alert cooperative  white female who appears her stated age in no acute distress.  HEENT: normocephalic atraumatic , Eyes: PERRL EOM's full, conjunctiva clear, Nares: paten,t no deformity discharge congested  Tender left maxilla area., Ears: no deformity EAC's clear TMs  Grey wax in right eac  brown. Mouth: clear OP, no lesions, edema.  Moist mucous membranes. Dentition in adequate repair. NECK: supple without masses, thyromegaly or bruits. CHEST/PULM:  Clear to auscultation and percussion breath sounds equal no wheeze , rales or rhonchi. No chest wall deformities or tenderness. CV: PMI is nondisplaced, S1 S2 no gallops, murmurs, rubs. Peripheral pulses are full without delay.No JVD .  Breast: normal by inspection . No dimpling, discharge, masses, tenderness or discharge . ABDOMEN: Bowel sounds normal nontender  No guard or rebound, no hepato splenomegal no CVA tenderness.  No hernia. Extremtities:  No clubbing cyanosis or edema, no acute joint swelling or redness no focal atrophy OA changes  NEURO:  Oriented x3, cranial nerves 3-12 appear to be intact, no obvious focal weakness,gait within normal limits no abnormal reflexes or asymmetrical Oriented x 3 and no noted deficits in memory, attention, and speech.  SKIN: No acute rashes normal turgor, color, no bruising or petechiae. Skin sun  changes  PSYCH: Oriented, good eye contact, no obvious depression anxiety, cognition and judgment appear normal. LN: no cervical axillary inguinal adenopathy        Assessment & Plan:  Preventive Health Care Counseled regarding healthy nutrition, exercise, sleep, injury prevention, calcium vit d and healthy weight . Says she is utd on immunizations pno zosta  OA no change   Sleep/ moodCautions with [place]  rec of low dose and use  Ok to remain for now.  cconisder wean in summer. recurrent sinusitisi and rhinitis   Can do a trial of antibiotic   Consider other if needed. Cont flonase Tinnitus  Hx and wax in eac  Flush to see if helps  Sx.  Hx of elevated mcv  Repeat labs today. Allergic rhinitis:  Continue flonase

## 2011-12-08 NOTE — Patient Instructions (Signed)
Antibiotic for sinus problem  Contact us if not gettign better for follow up.  Caution with ambien meds  5 mg is rec dose .  return office visit in 6 months or a needed for med check or as needed Will notify you  of labs when available.

## 2011-12-09 LAB — LDL CHOLESTEROL, DIRECT: Direct LDL: 155.7 mg/dL

## 2011-12-13 ENCOUNTER — Encounter: Payer: Self-pay | Admitting: *Deleted

## 2011-12-13 NOTE — Progress Notes (Signed)
Quick Note:    Letter sent to pt.  ______

## 2012-03-31 ENCOUNTER — Other Ambulatory Visit: Payer: Self-pay | Admitting: Dermatology

## 2012-05-03 ENCOUNTER — Other Ambulatory Visit: Payer: Self-pay | Admitting: Family Medicine

## 2012-05-03 DIAGNOSIS — Z1231 Encounter for screening mammogram for malignant neoplasm of breast: Secondary | ICD-10-CM

## 2012-05-29 ENCOUNTER — Ambulatory Visit
Admission: RE | Admit: 2012-05-29 | Discharge: 2012-05-29 | Disposition: A | Discharge status: Home / Self Care | Payer: Medicare Other | Source: Ambulatory Visit | Attending: Family Medicine | Admitting: Family Medicine

## 2012-05-29 DIAGNOSIS — Z1231 Encounter for screening mammogram for malignant neoplasm of breast: Secondary | ICD-10-CM

## 2012-06-20 ENCOUNTER — Telehealth: Payer: Self-pay | Admitting: Family Medicine

## 2012-06-20 NOTE — Telephone Encounter (Signed)
Pt is requesting refills.  Last seen CPE on 12/08/11.  Last filled on the same day with 2 refills.  Please advise. Send to [facility]

## 2012-06-21 ENCOUNTER — Other Ambulatory Visit: Payer: Self-pay | Admitting: Internal Medicine

## 2012-06-21 ENCOUNTER — Other Ambulatory Visit: Payer: Self-pay | Admitting: Family Medicine

## 2012-06-21 MED ORDER — ZOLPIDEM TARTRATE 10 MG PO TABS: 5.0000 mg | ORAL_TABLET | Freq: Every evening | ORAL | Status: DC | PRN

## 2012-06-21 NOTE — Telephone Encounter (Signed)
Ok to refill x 1   Disp 30  (The reason she ran out is that she was supposed to have a 6 months rov as per my last note.)

## 2012-06-21 NOTE — Telephone Encounter (Signed)
Spoke to the pt.  Scheduled her for an appt with WP on 07/04/12 @ 2:30pm.  Called Ambien to [facility].  Pt notified of all.

## 2012-07-04 ENCOUNTER — Encounter: Payer: Self-pay | Admitting: Internal Medicine

## 2012-07-04 ENCOUNTER — Ambulatory Visit (INDEPENDENT_AMBULATORY_CARE_PROVIDER_SITE_OTHER): Payer: Medicare Other | Admitting: Internal Medicine

## 2012-07-04 VITALS — BP 134/70 | HR 72 | Temp 98.3°F | Wt 116.0 lb

## 2012-07-04 DIAGNOSIS — G4700 Insomnia, unspecified: Secondary | ICD-10-CM

## 2012-07-04 DIAGNOSIS — Z79899 Other long term (current) drug therapy: Secondary | ICD-10-CM

## 2012-07-04 MED ORDER — ZOLPIDEM TARTRATE 10 MG PO TABS: 5.0000 mg | ORAL_TABLET | Freq: Every evening | ORAL | Status: DC | PRN

## 2012-07-04 MED ORDER — FLUTICASONE PROPIONATE 50 MCG/ACT NA SUSP: 2.0000 | Freq: Every day | NASAL | Status: DC

## 2012-07-04 NOTE — Patient Instructions (Signed)
Ok to continue with caution  ambien 5 mg  Dosing is the  Advised dosing.

## 2012-07-04 NOTE — Progress Notes (Signed)
  Subjective:    Patient ID: Kelly Wolfe, female    DOB: 07-21-1931, 78 y.o.   MRN: [id number]  HPI Patient comes in for followup of medicine check she continues to take Ambien at night low-dose 5 mg sometimes 10 mg to help her sleep. She'll take it at 10:00 at night get up at age although is awakened earlier denies any specific side effect or falling. Eye doc   Hx of cataracts surgery. Nl exam.  caffiene in the morning Etoh scotch in afternoon.   Allergy   Controlled     Review of Systems Negative for chest pain shortness of breath inability to exercise extreme depression anxiety or panic attacks at this time. No falling. No bleeding bruising Outpatient Encounter Prescriptions as of 07/04/2012  Medication Sig Dispense Refill  . calcium-vitamin D (OSCAL WITH D) 500-200 MG-UNIT per tablet Take 1 tablet by mouth daily.        . cetirizine (ZYRTEC) 10 MG tablet Take 10 mg by mouth daily.        . fluticasone (FLONASE) 50 MCG/ACT nasal spray Place 2 sprays into the nose daily.  16 g  3  . MULTIPLE VITAMIN PO Take by mouth.        . zolpidem (AMBIEN) 10 MG tablet Take 0.5 tablets (5 mg total) by mouth at bedtime as needed for sleep. Can take 10 mg if needed  30 tablet  3  . DISCONTD: fluticasone (FLONASE) 50 MCG/ACT nasal spray Place 2 sprays into the nose daily.  16 g  3  . DISCONTD: zolpidem (AMBIEN) 10 MG tablet Take 0.5 tablets (5 mg total) by mouth at bedtime as needed for sleep. Can take 10 mg if needed  30 tablet  0   Past history family history social history reviewed in the electronic medical record.      Objective:   Physical Exam BP 134/70  Pulse 72  Temp 98.3 F (36.8 C) (Oral)  Wt 116 lb (52.617 kg)  SpO2 95% wdwn in nad Neck: Supple without adenopathy or masses or bruits Neuro oriented x 3 nl insight and  Affect  somewhat increase fidgeting  Chest:  Nl respirations  CV:  S1-S2 no gallops or murmurs peripheral perfusion is normal     Assessment & Plan:  Sleep med  management   Patient feels that she's done well on the 5 mg to 10 mg of Ambien without significant side effect she still continues to have her early cocktail but does not mix the alcohol with the medication otherwise no unusual memory lapses except occasional name finding. No falling or neurologic symptoms. Discussed risk benefit other options which or not is acceptable since she seems to be doing well will continue her current medication. If she wishes to wean from the medicine discussed continuing her sleep hygiene and expectations of rebound insomnia. At this time we'll refill her medicine with 3 refills call us if needs more before next visit in 6 months for wellness.  Total visit > 50% spent counseling and coordinating care

## 2012-07-13 ENCOUNTER — Other Ambulatory Visit: Payer: Self-pay | Admitting: Dermatology

## 2012-08-29 ENCOUNTER — Other Ambulatory Visit: Payer: Self-pay | Admitting: Family Medicine

## 2012-08-30 ENCOUNTER — Other Ambulatory Visit: Payer: Self-pay | Admitting: Family Medicine

## 2012-08-30 NOTE — Telephone Encounter (Signed)
Pt is requesting refills.  Last seen on 07/04/12.  No future appt.  I do not see this on her med list or in her hx.  Please advise.  Thanks!!

## 2012-08-30 NOTE — Telephone Encounter (Signed)
ambien in on the med list  She should have had 3 30 rx   And may use 1/2    Can refill x 3 months  30 x 3

## 2012-08-31 MED ORDER — ZOLPIDEM TARTRATE 10 MG PO TABS: ORAL_TABLET | ORAL | Status: DC

## 2012-09-05 ENCOUNTER — Ambulatory Visit (INDEPENDENT_AMBULATORY_CARE_PROVIDER_SITE_OTHER): Payer: Medicare Other | Admitting: Internal Medicine

## 2012-09-05 ENCOUNTER — Encounter: Payer: Self-pay | Admitting: Internal Medicine

## 2012-09-05 VITALS — BP 156/82 | HR 69 | Temp 97.7°F | Wt 118.0 lb

## 2012-09-05 DIAGNOSIS — R030 Elevated blood-pressure reading, without diagnosis of hypertension: Secondary | ICD-10-CM

## 2012-09-05 DIAGNOSIS — N390 Urinary tract infection, site not specified: Secondary | ICD-10-CM | POA: Insufficient documentation

## 2012-09-05 DIAGNOSIS — R350 Frequency of micturition: Secondary | ICD-10-CM

## 2012-09-05 LAB — POCT URINALYSIS DIPSTICK
Bilirubin, UA: NEGATIVE
Glucose, UA: NEGATIVE
Nitrite, UA: POSITIVE
Urobilinogen, UA: 0.2

## 2012-09-05 MED ORDER — SULFAMETHOXAZOLE-TRIMETHOPRIM 800-160 MG PO TABS: 1.0000 | ORAL_TABLET | Freq: Two times a day (BID) | ORAL | Status: DC

## 2012-09-05 NOTE — Progress Notes (Signed)
  Subjective:    Patient ID: Kelly Wolfe, female    DOB: [birth year], 78 y.o.   MRN: [id number]  HPI Patient comes in today for SDA for  new problem evaluation. Onset about 7- 10 days ago of urgency frequency and lower abd pressure and back pain without fever chills  NVD new rash . [place] at the beach not getting better so came in today. No rx and no aggravators waxing  waning.  Review of Systems No fever chills hematuria no recent uti or antibiotic  No ant allergy bp usually ok  Outpatient Encounter Prescriptions as of 09/05/2012  Medication Sig Dispense Refill  . calcium-vitamin D (OSCAL WITH D) 500-200 MG-UNIT per tablet Take 1 tablet by mouth daily.        . cetirizine (ZYRTEC) 10 MG tablet Take 10 mg by mouth daily.        . fluticasone (FLONASE) 50 MCG/ACT nasal spray Place 2 sprays into the nose daily.  16 g  3  . MULTIPLE VITAMIN PO Take by mouth.        . zolpidem (AMBIEN) 10 MG tablet 1/2 tab at bedtime, may take 10 mg if needed  30 tablet  2   Past history family history social history reviewed in the electronic medical record.     Objective:   Physical Exam BP 156/82  Pulse 69  Temp 97.7 F (36.5 C) (Oral)  Wt 118 lb (53.524 kg)  SpO2 96% wdwn in nad  Non toxic looks well.  Abdomen:  Sof,t normal bowel sounds without hepatosplenomegaly, no guarding rebound or masses no CVA tenderness ua pos nitrites  3 + leuk    Assessment & Plan:  Acute UTI   Expectant management. Septra ds for 3- 5 days. cx pending. elevated bp reading check  And fu if  persistent or progressive goal below 140/90

## 2012-09-05 NOTE — Patient Instructions (Signed)
You have a bladder or urinary tract infection. Take the antibiotic  Should be better at end of medication .contact if you are not all better. Will contact you a bout culture results.

## 2012-09-07 LAB — URINE CULTURE: Colony Count: >=100000

## 2012-09-09 NOTE — Progress Notes (Signed)
Quick Note:  Tell patient that urine culture shows e coli sensitive to medication given . Should resolve with current treatment .FU if not better. ______ 

## 2012-10-06 NOTE — Telephone Encounter (Signed)
Opened in error

## 2012-10-31 ENCOUNTER — Encounter: Payer: Self-pay | Admitting: Family Medicine

## 2012-10-31 ENCOUNTER — Ambulatory Visit (INDEPENDENT_AMBULATORY_CARE_PROVIDER_SITE_OTHER): Payer: Medicare Other | Admitting: Family Medicine

## 2012-10-31 ENCOUNTER — Telehealth: Payer: Self-pay | Admitting: Internal Medicine

## 2012-10-31 VITALS — BP 148/80 | HR 85 | Temp 98.3°F | Wt 118.0 lb

## 2012-10-31 DIAGNOSIS — I10 Essential (primary) hypertension: Secondary | ICD-10-CM | POA: Insufficient documentation

## 2012-10-31 MED ORDER — LISINOPRIL 10 MG PO TABS: 10.0000 mg | ORAL_TABLET | Freq: Every day | ORAL | Status: DC

## 2012-10-31 NOTE — Progress Notes (Signed)
  Subjective:    Patient ID: Kelly Wolfe, female    DOB: 07-03-1931, 78 y.o.   MRN: [id number]  HPI Here for elevated BPs. Her BP here in October was 156/82. She has been having it checked by a nurse at her church the past few weeks and her systolics have been in the 160s and 170s. She feels fine, no HAs or chest pains or SOB. She does not use tobacco. She had normal labs last January.    Review of Systems  Constitutional: Negative.   Respiratory: Negative.   Cardiovascular: Negative.        Objective:   Physical Exam  Constitutional: She is oriented to person, place, and time. She appears well-developed and well-nourished. No distress.  Neck: No thyromegaly present.  Cardiovascular: Normal rate, regular rhythm, normal heart sounds and intact distal pulses.   Pulmonary/Chest: Effort normal and breath sounds normal.  Lymphadenopathy:    She has no cervical adenopathy.  Neurological: She is alert and oriented to person, place, and time.          Assessment & Plan:  New onset HTN. Start on Lisinopril 10 mg a day. Follow up with Dr. Fabian Sharp in 3 weeks. She may want to get some labs at that point.

## 2012-10-31 NOTE — Telephone Encounter (Signed)
[facility] Triage Call Report Triage Record Num: [id number] Operator: Caswell Corwin Patient Name: Kelly Wolfe Call Date & Time: 10/30/2012 8:03:12PM Patient Phone: [phone] PCP: Patient Gender: Female PCP Fax : Patient DOB: [birth year] [facility] Name: [facility] Reason for Call: Caller: Kelly Wolfe/Patient; PCP: [name] (Family Practice); CB#: [phone]; Call regarding Wants appt for blood pressure concerns. 10/22/12 B/P was 178/80 and 10/29/12 ws 164/84 at a B/P screening and nurse suggested she make an appt. She has not felt "real good". Has Tinnitis in right ears that came back about 10/16/12. Triaged Hypertension, diagnosed or suspected and all emergent SX R/O. Needs to be seen in 72 hrs for Multiple elvated blood pressure readings without other symptoms and no previous work-up or readings exceed expected range defined by treatment plan. Appt made with Clent Ridges on 10/31/12 t 1545. pt requested this time. Home care and call back instructions given. Protocol(s) Used: Hypertension, Diagnosed or Suspected Recommended Outcome per Protocol: See Provider within 72 Hours Reason for Outcome: Multiple elevated blood pressure readings without other symptoms AND no previous work-up OR readings exceed expected range defined by treatment plan Care Advice: Call EMS 911 if new symptoms develop, such as severe shortness of breath, chest pain, change in mental status, acute neurologic deficit, seizure, visual disturbances, pulse rate > 120 / minute, or very irregular pulse. ~ 12/

## 2012-11-09 ENCOUNTER — Telehealth: Payer: Self-pay | Admitting: Internal Medicine

## 2012-11-09 NOTE — Telephone Encounter (Signed)
Patient Information:  Caller Name: Valeria  Phone: [phone]  Patient: Kelly Wolfe  Gender: Female  DOB: [birth year]  Age: 78 Years  PCP: [name] (Family Practice)  Office Follow Up:  Does the office need to follow up with this patient?: No  Instructions For The Office: N/A  RN Note:  Caller suspects symptoms are side effects of Lisinopril started 10/30/12.  Symptoms  Reason For Call & Symptoms: Reports not feeling good since began Lisinopril.  Reports moderate fatigue, decreased appetite and productive cough, often worse in AM.  Reviewed Health History In EMR: Yes  Reviewed Medications In EMR: Yes  Reviewed Allergies In EMR: Yes  Reviewed Surgeries / Procedures: Yes  Date of Onset of Symptoms: 11/01/2012  Guideline(s) Used:  Weakness (Generalized) and Fatigue  Disposition Per Guideline:   Go to Office Now  Reason For Disposition Reached:   Moderate weakness (i.e., interferes with work, school, normal activities) and cause unknown  Advice Given:  Reassurance  Weakness often accompanies viral illnesses (e.g., colds and flu).  The weakness is usually worse the first 3 days of the illness, then gets better.  Call Back If:  Unable to stand or walk  Passes out  Breathing difficulty occurs  RN Overrode Recommendation:  Make Appointment  Decilined appointment for 11/09/12.  Appointment scheduled for 1330 11/10/12 with Dr Fabian Sharp per caller request.  Appointment Scheduled:  11/10/2012 13:30:21 Appointment Scheduled Provider:  [name] (Family Practice)

## 2012-11-10 ENCOUNTER — Encounter: Payer: Self-pay | Admitting: Internal Medicine

## 2012-11-10 ENCOUNTER — Ambulatory Visit (INDEPENDENT_AMBULATORY_CARE_PROVIDER_SITE_OTHER): Payer: Medicare Other | Admitting: Internal Medicine

## 2012-11-10 VITALS — BP 150/80 | HR 68 | Temp 97.6°F | Wt 116.0 lb

## 2012-11-10 DIAGNOSIS — J069 Acute upper respiratory infection, unspecified: Secondary | ICD-10-CM

## 2012-11-10 DIAGNOSIS — D709 Neutropenia, unspecified: Secondary | ICD-10-CM

## 2012-11-10 DIAGNOSIS — E785 Hyperlipidemia, unspecified: Secondary | ICD-10-CM

## 2012-11-10 DIAGNOSIS — R5381 Other malaise: Secondary | ICD-10-CM

## 2012-11-10 DIAGNOSIS — I10 Essential (primary) hypertension: Secondary | ICD-10-CM

## 2012-11-10 DIAGNOSIS — M1990 Unspecified osteoarthritis, unspecified site: Secondary | ICD-10-CM

## 2012-11-10 DIAGNOSIS — J309 Allergic rhinitis, unspecified: Secondary | ICD-10-CM

## 2012-11-10 LAB — LIPID PANEL
HDL: 65.7 mg/dL (ref >39.00)
Total CHOL/HDL Ratio: 3
Triglycerides: 137 mg/dL (ref 0.0–149.0)

## 2012-11-10 LAB — HEPATIC FUNCTION PANEL
ALT: 14 U/L (ref 0–35)
Alkaline Phosphatase: 59 U/L (ref 39–117)
Bilirubin, Direct: 0 mg/dL (ref 0.0–0.3)
Total Bilirubin: 0.6 mg/dL (ref 0.3–1.2)

## 2012-11-10 LAB — CBC WITH DIFFERENTIAL/PLATELET
Eosinophils Absolute: 0.1 10*3/uL (ref 0.0–0.7)
Eosinophils Relative: 2.7 % (ref 0.0–5.0)
Lymphocytes Relative: 23.7 % (ref 12.0–46.0)
MCV: 103.3 fl — ABNORMAL HIGH (ref 78.0–100.0)
Monocytes Absolute: 0.3 10*3/uL (ref 0.1–1.0)
Neutrophils Relative %: 65.7 % (ref 43.0–77.0)
Platelets: 266 10*3/uL (ref 150.0–400.0)
WBC: 4.1 10*3/uL — ABNORMAL LOW (ref 4.5–10.5)

## 2012-11-10 LAB — BASIC METABOLIC PANEL
BUN: 18 mg/dL (ref 6–23)
Calcium: 9.9 mg/dL (ref 8.4–10.5)
Chloride: 102 mEq/L (ref 96–112)
Creatinine, Ser: 0.9 mg/dL (ref 0.4–1.2)
GFR: 66.62 mL/min (ref >60.00)

## 2012-11-10 LAB — TSH: TSH: 1.46 u[IU]/mL (ref 0.35–5.50)

## 2012-11-10 MED ORDER — AMLODIPINE BESYLATE 2.5 MG PO TABS: 2.5000 mg | ORAL_TABLET | Freq: Every day | ORAL | Status: DC

## 2012-11-10 NOTE — Progress Notes (Signed)
Chief Complaint  Patient presents with  . Cough    Cough started after she started taking lisinopril.  Nasal congestion started 1 week ago.  . Nasal Congestion    HPI: Patient comes in today for SDA for  new problem evaluation. See last ov and can note Recently begun on acei for ht  And developed a cough but now also ur congesgtion and drainge than continues and persists overa week  doest feel that well  . He is known to have recurrent sinus problems.  She Took left over  Antibiotic for sinusitis for last 3 days. Uncertain to improvement. He states she just doesn't feel right but no nausea vomiting diarrhea or syncope. No unusual bleeding. No night sweats or chills. ROS: See pertinent positives and negatives per HPI. No syncope chest feels tight but no chest pain no racing heart unusual bleeding fever or chills just doesn't feel right  Past Medical History  Diagnosis Date  . Allergic rhinitis   . Osteoarthritis   . Neutropenia     with elevated MCV no dx saw heme in the past neg rheum zieminski  . Spinal stenosis   . Osteopenia     mild dexa 2007 -0.7 hip sp nl    No family history on file.  History   Social History  . Marital Status: Married    Spouse Name: widowed     Number of Children: N/A  . Years of Education: N/A   Occupational History  .     Social History Main Topics  . Smoking status: Never Smoker   . Smokeless tobacco: Never Used  . Alcohol Use: 4.2 oz/week    7 Glasses of wine per week  . Drug Use: No  . Sexually Active: Not Currently   Other Topics Concern  . None   Social History Narrative   RetiredWidowed 7/04 HH of 1Regular exercise- yes    Outpatient Encounter Prescriptions as of 11/10/2012  Medication Sig Dispense Refill  . calcium-vitamin D (OSCAL WITH D) 500-200 MG-UNIT per tablet Take 1 tablet by mouth daily.        . cetirizine (ZYRTEC) 10 MG tablet Take 10 mg by mouth daily as needed.       . fluticasone (FLONASE) 50 MCG/ACT nasal spray  Place 2 sprays into the nose daily.  16 g  3  . lisinopril (PRINIVIL,ZESTRIL) 10 MG tablet Take 1 tablet (10 mg total) by mouth daily.  30 tablet  2  . MULTIPLE VITAMIN PO Take by mouth.        . zolpidem (AMBIEN) 10 MG tablet 1/2 tab at bedtime, may take 10 mg if needed  30 tablet  2  . amLODipine (NORVASC) 2.5 MG tablet Take 1 tablet (2.5 mg total) by mouth daily.  30 tablet  3    EXAM:  BP 150/80  Pulse 68  Temp 97.6 F (36.4 C) (Oral)  Wt 116 lb (52.617 kg)  There is no height on file to calculate BMI.  GENERAL: vitals reviewed and listed above, alert, oriented, appears well hydrated and in no acute distress  HEENT: atraumatic, conjunctiva  clear, no obvious abnormalities on inspection of external nose and ears is is slightly congested face nontender EACs shows a small amount of wax left greater than right TM is gray and intact OP : no lesion edema or exudate   NECK: no obvious masses on inspection palpation no bruits are heard LUNGS: clear to auscultation bilaterally, no wheezes, rales or rhonchi,  good air movement CV: HRRR, no clubbing cyanosis or  peripheral edema nl cap refill  Abdomen soft without organomegaly guarding or rebound MS: moves all extremities without noticeable focal  abnormality has some OA changes PSYCH: pleasant and cooperative, no obvious depression mildly anxious no tremor normal cognition Blood pressure readings from the church or in the 150-160 range. Neurologic exam is grossly normal and nonfocal gait is normal ASSESSMENT AND PLAN:  Discussed the following assessment and plan:  1. HTN (hypertension)  Basic metabolic panel, CBC with Differential, Hepatic function panel, Lipid panel, TSH   prob still needs med help but stop the acei and change med   2. Viral upper respiratory tract infection with cough  Basic metabolic panel, CBC with Differential, Hepatic function panel, Lipid panel, TSH   vs  se of med   3. Malaise and fatigue  Basic metabolic panel,  CBC with Differential, Hepatic function panel, Lipid panel, TSH   timing cw medication effect   4. HYPERLIPIDEMIA, MILD  Basic metabolic panel, CBC with Differential, Hepatic function panel, Lipid panel, TSH  5. NEUTROPENIA NOS  Basic metabolic panel, CBC with Differential, Hepatic function panel, Lipid panel, TSH  6. ALLERGIC RHINITIS  Basic metabolic panel, CBC with Differential, Hepatic function panel, Lipid panel, TSH  7. OSTEOARTHRITIS  Basic metabolic panel, CBC with Differential, Hepatic function panel, Lipid panel, TSH   has recurrent sinusitis self treatment recently postnasal drainage. Suppose it is possible she had viral illness at the same time she began her lisinopril. That would be an unusual coincidence.  -Patient advised to return or notify health care team  immediately if symptoms worsen or persist or new concerns arise.  Patient Instructions  Stop the lisinopril as this could be the culprit of why you feel badly and certainly can cause a cough . That should go away in a week or so.  You exam is otherwise  Normal .  Monitor BP readings  After a few days  If feeling better then we can change medication to low dose amlodipine. 2.5 mg per day.  ROV in about  1 month or prn . Will notify you  of labs when available.   Neta Mends. Panosh M.D.

## 2012-11-10 NOTE — Patient Instructions (Addendum)
Stop the lisinopril as this could be the culprit of why you feel badly and certainly can cause a cough . That should go away in a week or so.  You exam is otherwise  Normal .  Monitor BP readings  After a few days  If feeling better then we can change medication to low dose amlodipine. 2.5 mg per day.  ROV in about  1 month or prn . Will notify you  of labs when available.

## 2012-11-17 ENCOUNTER — Telehealth: Payer: Self-pay | Admitting: Internal Medicine

## 2012-11-17 NOTE — Telephone Encounter (Signed)
Caller: [facility]/Patient; Phone: [phone]; Reason for Call: Patient returning call from office.  States was out of town for [employer] and returned today.  Per Epic, Dr.  Fabian Sharp wanted patient to know that "labs show cbc about the same for her.  Thryoid is ok .   However her blood sugar was 165 which is elevated .  Limit sweets simple carbohydrates And we can recheck this at her visit in January.  " Patient advised; no questions or concerns at this time.  Info to office.  Krs/can

## 2012-11-20 NOTE — Telephone Encounter (Signed)
Noted on lab that the pt has been notified by CAN.

## 2012-11-21 ENCOUNTER — Ambulatory Visit: Payer: Medicare Other | Admitting: Internal Medicine

## 2012-12-05 ENCOUNTER — Ambulatory Visit (INDEPENDENT_AMBULATORY_CARE_PROVIDER_SITE_OTHER): Payer: Medicare Other | Admitting: Internal Medicine

## 2012-12-05 ENCOUNTER — Encounter: Payer: Self-pay | Admitting: Internal Medicine

## 2012-12-05 VITALS — BP 136/70 | HR 72 | Wt 120.0 lb

## 2012-12-05 DIAGNOSIS — R053 Chronic cough: Secondary | ICD-10-CM

## 2012-12-05 DIAGNOSIS — R059 Cough, unspecified: Secondary | ICD-10-CM

## 2012-12-05 DIAGNOSIS — I10 Essential (primary) hypertension: Secondary | ICD-10-CM

## 2012-12-05 DIAGNOSIS — R05 Cough: Secondary | ICD-10-CM

## 2012-12-05 NOTE — Patient Instructions (Signed)
Check blood pressure readings about once a week. Or as needed. If rising in to the elevated range 140/90 or above then contact us and we may advise start the new medication.   ROv  in 6 months or as needed

## 2012-12-05 NOTE — Progress Notes (Signed)
Chief Complaint  Patient presents with  . Hypertension    f/u. pt has not been taking the amlodipine but states that she will start if her BP is high today    HPI: Here for fu bp control and meds  Cough off of acei   hasnt started the amlodipine yet .  Feels okay hasn't checked her blood pressure doesn't trust her machine ROS: See pertinent positives and negatives per HPI. No symptoms bleeding syncope chest pain.  Past Medical History  Diagnosis Date  . Allergic rhinitis   . Osteoarthritis   . Neutropenia     with elevated MCV no dx saw heme in the past neg rheum zieminski  . Spinal stenosis   . Osteopenia     mild dexa 2007 -0.7 hip sp nl    No family history on file.  History   Social History  . Marital Status: Married    Spouse Name: widowed     Number of Children: N/A  . Years of Education: N/A   Occupational History  .     Social History Main Topics  . Smoking status: Never Smoker   . Smokeless tobacco: Never Used  . Alcohol Use: 4.2 oz/week    7 Glasses of wine per week  . Drug Use: No  . Sexually Active: Not Currently   Other Topics Concern  . None   Social History Narrative   RetiredWidowed 7/04 HH of 1Regular exercise- yes    Outpatient Encounter Prescriptions as of 12/05/2012  Medication Sig Dispense Refill  . calcium-vitamin D (OSCAL WITH D) 500-200 MG-UNIT per tablet Take 1 tablet by mouth daily.        . cetirizine (ZYRTEC) 10 MG tablet Take 10 mg by mouth daily as needed.       . fluticasone (FLONASE) 50 MCG/ACT nasal spray Place 2 sprays into the nose daily.  16 g  3  . MULTIPLE VITAMIN PO Take by mouth.        . zolpidem (AMBIEN) 10 MG tablet 1/2 tab at bedtime, may take 10 mg if needed  30 tablet  2  . [DISCONTINUED] lisinopril (PRINIVIL,ZESTRIL) 10 MG tablet Take 1 tablet (10 mg total) by mouth daily.  30 tablet  2  . amLODipine (NORVASC) 2.5 MG tablet Take 1 tablet (2.5 mg total) by mouth daily.  30 tablet  3    EXAM:  BP 136/70  Pulse  72  Wt 120 lb (54.432 kg)  SpO2 98%  There is no height on file to calculate BMI.  GENERAL: vitals reviewed and listed above, alert, oriented, appears well hydrated and in no acute distress  HEENT: atraumatic, conjunctiva  clear, no obvious abnormalities on inspection of external nose and ears OP : no lesion edema or exudate   NECK: no obvious masses on inspection palpation   LUNGS: clear to auscultation bilaterally, no wheezes, rales or rhonchi, good air movement  CV: HRRR, no clubbing cyanosis or  peripheral edema nl cap refill blood pressure reading in both arms done today  MS: moves all extremities without noticeable focal  abnormality  PSYCH: pleasant and cooperative, no obvious depression or anxiety  ASSESSMENT AND PLAN:  Discussed the following assessment and plan:  1. HTN (hypertension)    off ace for a month and good readings today   will follow closely and can add med if rising again   will get checked at fd and fu 6 months   2. Cough, persistent  resolved prob from acei    -Patient advised to return or notify health care team  immediately if symptoms worsen or persist or new concerns arise.  Patient Instructions  Check blood pressure readings about once a week. Or as needed. If rising in to the elevated range 140/90 or above then contact us and we may advise start the new medication.   ROv  in 6 months or as needed      Neta Mends. Deshone Lyssy M.D.

## 2013-03-14 ENCOUNTER — Telehealth: Payer: Self-pay | Admitting: Family Medicine

## 2013-03-14 NOTE — Telephone Encounter (Signed)
Last seen: 12/05/12 Last filled: 08/31/12 #30 with 2 additional refills Has a follow up on 06/04/13. Please advise.  Thanks!!

## 2013-03-14 NOTE — Telephone Encounter (Signed)
Can refill  30  X 2

## 2013-03-15 ENCOUNTER — Other Ambulatory Visit: Payer: Self-pay | Admitting: Family Medicine

## 2013-03-15 MED ORDER — ZOLPIDEM TARTRATE 10 MG PO TABS: ORAL_TABLET | ORAL | Status: DC

## 2013-03-15 NOTE — Telephone Encounter (Signed)
Called and given by phone.

## 2013-04-10 ENCOUNTER — Other Ambulatory Visit: Payer: Self-pay | Admitting: Family Medicine

## 2013-04-10 MED ORDER — FLUTICASONE PROPIONATE 50 MCG/ACT NA SUSP: 2.0000 | Freq: Every day | NASAL | Status: DC

## 2013-04-17 ENCOUNTER — Other Ambulatory Visit: Payer: Self-pay | Admitting: Internal Medicine

## 2013-04-17 DIAGNOSIS — I10 Essential (primary) hypertension: Secondary | ICD-10-CM

## 2013-04-17 DIAGNOSIS — E785 Hyperlipidemia, unspecified: Secondary | ICD-10-CM

## 2013-04-17 DIAGNOSIS — J069 Acute upper respiratory infection, unspecified: Secondary | ICD-10-CM

## 2013-04-17 DIAGNOSIS — M1990 Unspecified osteoarthritis, unspecified site: Secondary | ICD-10-CM

## 2013-04-17 DIAGNOSIS — J309 Allergic rhinitis, unspecified: Secondary | ICD-10-CM

## 2013-04-17 DIAGNOSIS — D709 Neutropenia, unspecified: Secondary | ICD-10-CM

## 2013-04-17 DIAGNOSIS — R5381 Other malaise: Secondary | ICD-10-CM

## 2013-04-17 MED ORDER — AMLODIPINE BESYLATE 2.5 MG PO TABS: 2.5000 mg | ORAL_TABLET | Freq: Every day | ORAL | Status: DC

## 2013-06-04 ENCOUNTER — Encounter: Payer: Self-pay | Admitting: Internal Medicine

## 2013-06-04 ENCOUNTER — Ambulatory Visit: Payer: Medicare Other | Admitting: Internal Medicine

## 2013-06-04 ENCOUNTER — Ambulatory Visit (INDEPENDENT_AMBULATORY_CARE_PROVIDER_SITE_OTHER): Payer: Medicare Other | Admitting: Internal Medicine

## 2013-06-04 VITALS — BP 138/80 | HR 82 | Temp 98.0°F | Wt 117.0 lb

## 2013-06-04 DIAGNOSIS — G4700 Insomnia, unspecified: Secondary | ICD-10-CM

## 2013-06-04 DIAGNOSIS — M79609 Pain in unspecified limb: Secondary | ICD-10-CM

## 2013-06-04 DIAGNOSIS — R5381 Other malaise: Secondary | ICD-10-CM

## 2013-06-04 DIAGNOSIS — M79601 Pain in right arm: Secondary | ICD-10-CM

## 2013-06-04 DIAGNOSIS — I10 Essential (primary) hypertension: Secondary | ICD-10-CM

## 2013-06-04 DIAGNOSIS — R5383 Other fatigue: Secondary | ICD-10-CM

## 2013-06-04 MED ORDER — ZOLPIDEM TARTRATE 5 MG PO TABS: 5.0000 mg | ORAL_TABLET | Freq: Every evening | ORAL | Status: DC | PRN

## 2013-06-04 MED ORDER — TRAZODONE HCL 50 MG PO TABS: 25.0000 mg | ORAL_TABLET | Freq: Every evening | ORAL | Status: DC | PRN

## 2013-06-04 NOTE — Patient Instructions (Addendum)
Take the amlodipine  Low dose for now  Repeat bp  Is better   After second one. Cough can take 3-4 weeks to subside if from lisinopril .  Can try  Weaning from tne ambien  Can try trazadone  50 - 75 mg per night  Alternating at night   , Attend to sleep hygiene Can add melatonin 4 hours before wanting to sleep  May help some. NO alcohol  With ambien  Can cause memory lapse . DO not drive  With ambien  Until after 10 + hours  Later.  Keep monitoring your blood pressure as you are doing  If  Over 148 regularly  Then  We can adjust medication.   ROV in 2-3 months to see  What sleep BP  Etc is .

## 2013-06-04 NOTE — Progress Notes (Signed)
Chief Complaint  Patient presents with  . Follow-up    HPI: FU 6 months for Patient comes in today for follow up of  multiple medical problems.   bp and  meds  Sleep   BP :Was on on lisinopril  for bp     12 13   Stopped a few weeks ago then restarted  amlodip[ine 2.5     Now  Going off and one.  Cough not that bad.  Both medicines were given about 5-6 months ago but she is not taking both.  Bp ;log from her church a week or so ago showed a few elevations in the 160s.  New problem today pain and ache right arm radiating  possibly from Pain shoulder  some spasm in her neck Trap for one day took alevel.    Hx  of shoulder surgery  Right in the past.   Sleep taking a half of a 10 mg Ambien every night to sleep Hs  Taking    Thinks she is dependent on this . About 10 pm and up at 8  Am.   Doesn't think it's depression she thinks she is addicted to the medicine. No other side effect next a fogginess etc. no falling. ROS: See pertinent positives and negatives per HPI. Minor cough   No sob.   No energy  Doesn't  Want to get up and do things. But isn't despondent or hopeless.  Past Medical History  Diagnosis Date  . Allergic rhinitis   . Osteoarthritis   . Neutropenia     with elevated MCV no dx saw heme in the past neg rheum zieminski  . Spinal stenosis   . Osteopenia     mild dexa 2007 -0.7 hip sp nl    No family history on file.  History   Social History  . Marital Status: Married    Spouse Name: widowed     Number of Children: N/A  . Years of Education: N/A   Occupational History  .     Social History Main Topics  . Smoking status: Never Smoker   . Smokeless tobacco: Never Used  . Alcohol Use: 4.2 oz/week    7 Glasses of wine per week  . Drug Use: No  . Sexually Active: Not Currently   Other Topics Concern  . None   Social History Narrative   Retired   Widowed 7/04    HH of 1   Regular exercise- yes    Outpatient Encounter Prescriptions as of 06/04/2013   Medication Sig Dispense Refill  . amLODipine (NORVASC) 2.5 MG tablet Take 1 tablet (2.5 mg total) by mouth daily.  30 tablet  5  . calcium-vitamin D (OSCAL WITH D) 500-200 MG-UNIT per tablet Take 1 tablet by mouth daily.        . cetirizine (ZYRTEC) 10 MG tablet Take 10 mg by mouth daily as needed.       . fluticasone (FLONASE) 50 MCG/ACT nasal spray Place 2 sprays into the nose daily.  16 g  3  . MULTIPLE VITAMIN PO Take by mouth.        . [DISCONTINUED] zolpidem (AMBIEN) 10 MG tablet 1/2 tab at bedtime, may take 10 mg if needed  30 tablet  2  . traZODone (DESYREL) 50 MG tablet Take 0.5-1 tablets (25-50 mg total) by mouth at bedtime as needed for sleep.  30 tablet  3  . zolpidem (AMBIEN) 5 MG tablet Take 1 tablet (5 mg total)  by mouth at bedtime as needed for sleep. Wean as possible  30 tablet  0  . [DISCONTINUED] lisinopril (PRINIVIL,ZESTRIL) 10 MG tablet Take 10 mg by mouth daily.       No facility-administered encounter medications on file as of 06/04/2013.    EXAM:  BP 138/80  Pulse 82  Temp(Src) 98 F (36.7 C) (Oral)  Wt 117 lb (53.071 kg)  BMI 21.23 kg/m2  SpO2 97%  Body mass index is 21.23 kg/(m^2).  GENERAL: vitals reviewed and listed above, alert, oriented, appears well hydrated and in no acute distress looks well good balance HEENT: atraumatic, conjunctiva  clear, no obvious abnormalities on inspection of external nose and ears face nontender NECK: no obvious masses on inspection palpation  LUNGS: clear to auscultation bilaterally, no wheezes, rales or rhonchi, good air movement CV: HRRR, no clubbing cyanosis or  peripheral edema nl cap refill  Abdomen soft without organomegaly guarding or rebound MS: moves all extremities without noticeable focal  Abnormality good  rom of arm shoulder  No atrophy PSYCH: pleasant and cooperative, no obvious depression or anxiety  ASSESSMENT AND PLAN:  Discussed the following assessment and plan:  HTN (hypertension) - Better today  she is actually not been on both medicines at the same time for very long. Because there / cough on lisinopril will continue on low dose amlodipine  SLEEPLESSNESS - i agree she is now dependant on ambien for sleep and problematic  disc strategies for wd   Pain, arm, right - poss from shoulder or neck  conservative rx   MALAISE AND FATIGUE - not a nesw problem  comes and goes Discussed strategies try 5 mg of Ambien and cut that in half at times can try alternating every few days and trazodone anywhere from 25-75 mg.   Expectant management asleep will get worse before it gets better. Blood pressure readings he thinks are better on amlodipine in the office she never took the amlodipine and lisinopril on a regular basis together. May have had a cough with fosinopril but think she could take it if we thought it was the better medicine. At this point in time I don't think depression is a big part of her symptom complex -Patient advised to return or notify health care team  if symptoms worsen or persist or new concerns arise.  Patient Instructions  Take the amlodipine  Low dose for now  Repeat bp  Is better   After second one. Cough can take 3-4 weeks to subside if from lisinopril .  Can try  Weaning from tne ambien  Can try trazadone  50 - 75 mg per night  Alternating at night   , Attend to sleep hygiene Can add melatonin 4 hours before wanting to sleep  May help some. NO alcohol  With ambien  Can cause memory lapse . DO not drive  With ambien  Until after 10 + hours  Later.  Keep monitoring your blood pressure as you are doing  If  Over 148 regularly  Then  We can adjust medication.   ROV in 2-3 months to see  What sleep BP  Etc is .        Neta Mends. Delicia Berens M.D.

## 2013-08-27 ENCOUNTER — Encounter: Payer: Self-pay | Admitting: Internal Medicine

## 2013-09-05 ENCOUNTER — Ambulatory Visit (INDEPENDENT_AMBULATORY_CARE_PROVIDER_SITE_OTHER): Payer: Medicare Other | Admitting: Internal Medicine

## 2013-09-05 ENCOUNTER — Encounter: Payer: Self-pay | Admitting: Internal Medicine

## 2013-09-05 VITALS — BP 136/80 | HR 74 | Temp 98.1°F | Wt 119.0 lb

## 2013-09-05 DIAGNOSIS — I10 Essential (primary) hypertension: Secondary | ICD-10-CM

## 2013-09-05 DIAGNOSIS — N390 Urinary tract infection, site not specified: Secondary | ICD-10-CM

## 2013-09-05 DIAGNOSIS — R300 Dysuria: Secondary | ICD-10-CM

## 2013-09-05 DIAGNOSIS — G4700 Insomnia, unspecified: Secondary | ICD-10-CM

## 2013-09-05 LAB — POCT URINALYSIS DIPSTICK
Protein, UA: 1
Spec Grav, UA: 1.02
Urobilinogen, UA: 0.2

## 2013-09-05 MED ORDER — TRAZODONE HCL 50 MG PO TABS
25.0000 mg | ORAL_TABLET | Freq: Every evening | ORAL | Status: DC | PRN
Start: 2013-09-05 — End: 2015-01-14

## 2013-09-05 MED ORDER — SULFAMETHOXAZOLE-TRIMETHOPRIM 800-160 MG PO TABS: 1.0000 | ORAL_TABLET | Freq: Two times a day (BID) | ORAL | Status: DC

## 2013-09-05 NOTE — Patient Instructions (Signed)
Looks like . Another uti  Take med fu  if not better .

## 2013-09-05 NOTE — Progress Notes (Signed)
Chief Complaint  Patient presents with  . Dysuria    Sx started on Friday of last week.    HPI: Patient comes in today for SDA for  new problem evaluation.  Has hx of recurrent  utis  But last one was a year ago.  E coli   Onset 4 days ago when at beach urgency and frequncy no pain fever chills some lbp nause no v d    Took azo when at [employer]   Some help  Trying to get off ambien as we discussed before..  Print out another trazadone rx incase wants to try it  May have lost the previous one.  Monitoring BP  Ok  ROS: See pertinent positives and negatives per HPI.  Past Medical History  Diagnosis Date  . Allergic rhinitis   . Osteoarthritis   . Neutropenia     with elevated MCV no dx saw heme in the past neg rheum zieminski  . Spinal stenosis   . Osteopenia     mild dexa 2007 -0.7 hip sp nl    No family history on file.  History   Social History  . Marital Status: Married    Spouse Name: widowed     Number of Children: N/A  . Years of Education: N/A   Occupational History  .     Social History Main Topics  . Smoking status: Never Smoker   . Smokeless tobacco: Never Used  . Alcohol Use: 4.2 oz/week    7 Glasses of wine per week  . Drug Use: No  . Sexual Activity: Not Currently   Other Topics Concern  . None   Social History Narrative   Retired   Widowed 7/04    HH of 1   Regular exercise- yes    Outpatient Encounter Prescriptions as of 09/05/2013  Medication Sig Dispense Refill  . amLODipine (NORVASC) 2.5 MG tablet Take 1 tablet (2.5 mg total) by mouth daily.  30 tablet  5  . calcium-vitamin D (OSCAL WITH D) 500-200 MG-UNIT per tablet Take 1 tablet by mouth daily.        . cetirizine (ZYRTEC) 10 MG tablet Take 10 mg by mouth daily as needed.       . fluticasone (FLONASE) 50 MCG/ACT nasal spray Place 2 sprays into the nose daily.  16 g  3  . MULTIPLE VITAMIN PO Take by mouth.        . zolpidem (AMBIEN) 5 MG tablet Take 1 tablet (5 mg total) by  mouth at bedtime as needed for sleep. Wean as possible  30 tablet  0  . sulfamethoxazole-trimethoprim (SEPTRA DS) 800-160 MG per tablet Take 1 tablet by mouth 2 (two) times daily.  10 tablet  0  . traZODone (DESYREL) 50 MG tablet Take 0.5-1 tablets (25-50 mg total) by mouth at bedtime as needed for sleep.  30 tablet  3  . [DISCONTINUED] traZODone (DESYREL) 50 MG tablet Take 0.5-1 tablets (25-50 mg total) by mouth at bedtime as needed for sleep.  30 tablet  3   No facility-administered encounter medications on file as of 09/05/2013.    EXAM:  BP 136/80  Pulse 74  Temp(Src) 98.1 F (36.7 C) (Oral)  Wt 119 lb (53.978 kg)  BMI 21.59 kg/m2  SpO2 95%  Body mass index is 21.59 kg/(m^2).  GENERAL: vitals reviewed and listed above, alert, oriented, appears well hydrated and in no acute distress HEENT: atraumatic, conjunctiva  clear, no obvious abnormalities on inspection  of external nose and ears  NECK: no obvious masses on inspection palpation   Abdomen:  Sof,t normal bowel sounds without hepatosplenomegaly, no guarding rebound or masses no CVA tenderness MS: moves all extremities without noticeable focal  abnormality PSYCH: pleasant and cooperative, no obvious depression or anxiety UA poss nitrites 3 + leuk ASSESSMENT AND PLAN:  Discussed the following assessment and plan:  Dysuria - Plan: POC Urinalysis Dipstick  UTI (urinary tract infection) - Plan: Urine culture  HTN (hypertension) - normal today  Insomnia, unspecified - revisited info  pt aware risk benefit discussion Printed trazadone rx and review -Patient advised to return or notify health care team  if symptoms worsen or persist or new concerns arise.  Patient Instructions  Looks like . Another uti  Take med fu  if not better .     Kelly Wolfe M.D.

## 2013-09-10 ENCOUNTER — Other Ambulatory Visit: Payer: Self-pay | Admitting: Family Medicine

## 2013-09-10 MED ORDER — CIPROFLOXACIN HCL 250 MG PO TABS: 250.0000 mg | ORAL_TABLET | Freq: Two times a day (BID) | ORAL | Status: DC

## 2013-11-06 ENCOUNTER — Other Ambulatory Visit: Payer: Self-pay | Admitting: Internal Medicine

## 2014-02-21 ENCOUNTER — Encounter: Payer: Self-pay | Admitting: Internal Medicine

## 2014-04-16 ENCOUNTER — Other Ambulatory Visit: Payer: Self-pay | Admitting: Internal Medicine

## 2014-04-16 NOTE — Telephone Encounter (Signed)
Sent to the pharmacy by e-scribe. 

## 2014-05-28 ENCOUNTER — Telehealth: Payer: Self-pay | Admitting: Internal Medicine

## 2014-05-28 NOTE — Telephone Encounter (Signed)
Spoke to the pt.  Sx started 4-5 days ago.  Has nasal congestion with a yellow/blood streaked discharge.  Post nasal drip and sinus pressure.  Has been treating with Coricidin HBP but without much success.  Denies fever.  Would like an antibiotic called to the pharmacy.  Please advise.  Thanks!  Last seen 09/05/13

## 2014-05-28 NOTE — Telephone Encounter (Signed)
Discussed with WP.  Antibiotics are not advised at this time.  She may use a saline nasal wash and Afrin Nasal spray for 3 days.  Advised that she call back if not better by next week or if sx worsen.  She will then need an office visit.  Will not prescribe antibiotics (if needed) without being seen.  Pt was notified of all and understands instructions.  Will call back if needed.

## 2014-05-28 NOTE — Telephone Encounter (Signed)
Pt has head and throat congestion. [facility]. Pt decline ov

## 2014-07-03 ENCOUNTER — Encounter: Payer: Self-pay | Admitting: Internal Medicine

## 2014-07-03 ENCOUNTER — Ambulatory Visit (INDEPENDENT_AMBULATORY_CARE_PROVIDER_SITE_OTHER): Payer: Medicare HMO | Admitting: Internal Medicine

## 2014-07-03 ENCOUNTER — Telehealth: Payer: Self-pay | Admitting: Internal Medicine

## 2014-07-03 VITALS — BP 140/78 | Temp 98.2°F | Ht 62.5 in | Wt 114.0 lb

## 2014-07-03 DIAGNOSIS — D709 Neutropenia, unspecified: Secondary | ICD-10-CM

## 2014-07-03 DIAGNOSIS — H6121 Impacted cerumen, right ear: Secondary | ICD-10-CM

## 2014-07-03 DIAGNOSIS — G4700 Insomnia, unspecified: Secondary | ICD-10-CM

## 2014-07-03 DIAGNOSIS — R5383 Other fatigue: Secondary | ICD-10-CM

## 2014-07-03 DIAGNOSIS — M1990 Unspecified osteoarthritis, unspecified site: Secondary | ICD-10-CM

## 2014-07-03 DIAGNOSIS — R7309 Other abnormal glucose: Secondary | ICD-10-CM

## 2014-07-03 DIAGNOSIS — E785 Hyperlipidemia, unspecified: Secondary | ICD-10-CM

## 2014-07-03 DIAGNOSIS — I10 Essential (primary) hypertension: Secondary | ICD-10-CM

## 2014-07-03 DIAGNOSIS — H6120 Impacted cerumen, unspecified ear: Secondary | ICD-10-CM

## 2014-07-03 DIAGNOSIS — Z23 Encounter for immunization: Secondary | ICD-10-CM

## 2014-07-03 DIAGNOSIS — Z7189 Other specified counseling: Secondary | ICD-10-CM | POA: Insufficient documentation

## 2014-07-03 DIAGNOSIS — R739 Hyperglycemia, unspecified: Secondary | ICD-10-CM

## 2014-07-03 DIAGNOSIS — Z0000 Encounter for general adult medical examination without abnormal findings: Secondary | ICD-10-CM

## 2014-07-03 DIAGNOSIS — R5381 Other malaise: Secondary | ICD-10-CM

## 2014-07-03 LAB — CBC WITH DIFFERENTIAL/PLATELET
BASOS PCT: 0.4 % (ref 0.0–3.0)
Basophils Absolute: 0 10*3/uL (ref 0.0–0.1)
EOS ABS: 0.2 10*3/uL (ref 0.0–0.7)
Eosinophils Relative: 4.8 % (ref 0.0–5.0)
HCT: 39.8 % (ref 36.0–46.0)
HEMOGLOBIN: 13.4 g/dL (ref 12.0–15.0)
LYMPHS PCT: 21.9 % (ref 12.0–46.0)
Lymphs Abs: 0.9 10*3/uL (ref 0.7–4.0)
MCHC: 33.6 g/dL (ref 30.0–36.0)
MCV: 104.5 fl — ABNORMAL HIGH (ref 78.0–100.0)
MONOS PCT: 7.8 % (ref 3.0–12.0)
Monocytes Absolute: 0.3 10*3/uL (ref 0.1–1.0)
NEUTROS ABS: 2.6 10*3/uL (ref 1.4–7.7)
NEUTROS PCT: 65.1 % (ref 43.0–77.0)
Platelets: 253 10*3/uL (ref 150.0–400.0)
RBC: 3.81 Mil/uL — AB (ref 3.87–5.11)
RDW: 13.6 % (ref 11.5–15.5)
WBC: 3.9 10*3/uL — AB (ref 4.0–10.5)

## 2014-07-03 LAB — BASIC METABOLIC PANEL
BUN: 18 mg/dL (ref 6–23)
CALCIUM: 9.1 mg/dL (ref 8.4–10.5)
CO2: 29 mEq/L (ref 19–32)
CREATININE: 0.9 mg/dL (ref 0.4–1.2)
Chloride: 105 mEq/L (ref 96–112)
GFR: 63.8 mL/min (ref >60.00)
Glucose, Bld: 87 mg/dL (ref 70–99)
Potassium: 4.1 mEq/L (ref 3.5–5.1)
SODIUM: 140 meq/L (ref 135–145)

## 2014-07-03 LAB — HEMOGLOBIN A1C: HEMOGLOBIN A1C: 5.6 % (ref 4.6–6.5)

## 2014-07-03 LAB — T4, FREE: FREE T4: 0.74 ng/dL (ref 0.60–1.60)

## 2014-07-03 LAB — TSH: TSH: 0.42 u[IU]/mL (ref 0.35–4.50)

## 2014-07-03 MED ORDER — AMLODIPINE BESYLATE 2.5 MG PO TABS: ORAL_TABLET | ORAL | Status: DC

## 2014-07-03 NOTE — Patient Instructions (Addendum)
blood pressure goal is low140s and below . As long as doing well.    If you stop the amlodipine  You blood pressure may rise over the next month or so so need to continue to monitor  Continue lifestyle intervention healthy eating and exercise . Healthy lifestyle includes : At least 150 minutes of exercise weeks  , weight at healthy levels, which is usually   BMI 19-25. Avoid trans fats and processed foods;  Increase fresh fruits and veges to 5 servings per day. And avoid sweet beverages including tea and juice. Mediterranean diet with olive oil and nuts have been noted to be heart and brain healthy . Avoid tobacco products . Limit  alcohol to  7 per week for women and 14 servings for men.  Get adequate sleep . Can try  Off alcohol for a week and see if sleep some better .  Will notify you  of labs when available.     .  Hypertension Hypertension, commonly called high blood pressure, is when the force of blood pumping through your arteries is too strong. Your arteries are the blood vessels that carry blood from your heart throughout your body. A blood pressure reading consists of a higher number over a lower number, such as 110/72. The higher number (systolic) is the pressure inside your arteries when your heart pumps. The lower number (diastolic) is the pressure inside your arteries when your heart relaxes. Ideally you want your blood pressure below 120/80. Hypertension forces your heart to work harder to pump blood. Your arteries may become narrow or stiff. Having hypertension puts you at risk for heart disease, stroke, and other problems.  RISK FACTORS Some risk factors for high blood pressure are controllable. Others are not.  Risk factors you cannot control include:   Race. You may be at higher risk if you are African American.  Age. Risk increases with age.  Gender. Men are at higher risk than women before age 35 years. After age 58, women are at higher risk than men. Risk factors  you can control include:  Not getting enough exercise or physical activity.  Being overweight.  Getting too much fat, sugar, calories, or salt in your diet.  Drinking too much alcohol. SIGNS AND SYMPTOMS Hypertension does not usually cause signs or symptoms. Extremely high blood pressure (hypertensive crisis) may cause headache, anxiety, shortness of breath, and nosebleed. DIAGNOSIS  To check if you have hypertension, your health care provider will measure your blood pressure while you are seated, with your arm held at the level of your heart. It should be measured at least twice using the same arm. Certain conditions can cause a difference in blood pressure between your right and left arms. A blood pressure reading that is higher than normal on one occasion does not mean that you need treatment. If one blood pressure reading is high, ask your health care provider about having it checked again. TREATMENT  Treating high blood pressure includes making lifestyle changes and possibly taking medicine. Living a healthy lifestyle can help lower high blood pressure. You may need to change some of your habits. Lifestyle changes may include:  Following the DASH diet. This diet is high in fruits, vegetables, and whole grains. It is low in salt, red meat, and added sugars.  Getting at least 2 hours of brisk physical activity every week.  Losing weight if necessary.  Not smoking.  Limiting alcoholic beverages.  Learning ways to reduce stress. If lifestyle changes are  not enough to get your blood pressure under control, your health care provider may prescribe medicine. You may need to take more than one. Work closely with your health care provider to understand the risks and benefits. HOME CARE INSTRUCTIONS  Have your blood pressure rechecked as directed by your health care provider.   Take medicines only as directed by your health care provider. Follow the directions carefully. Blood pressure  medicines must be taken as prescribed. The medicine does not work as well when you skip doses. Skipping doses also puts you at risk for problems.   Do not smoke.   Monitor your blood pressure at home as directed by your health care provider. SEEK MEDICAL CARE IF:   You think you are having a reaction to medicines taken.  You have recurrent headaches or feel dizzy.  You have swelling in your ankles.  You have trouble with your vision. SEEK IMMEDIATE MEDICAL CARE IF:  You develop a severe headache or confusion.  You have unusual weakness, numbness, or feel faint.  You have severe chest or abdominal pain.  You vomit repeatedly.  You have trouble breathing. MAKE SURE YOU:   Understand these instructions.  Will watch your condition.  Will get help right away if you are not doing well or get worse. Document Released: 11/01/2005 Document Revised: 03/18/2014 Document Reviewed: 08/24/2013 [facility] Patient Information 2015 [place], [place]. This information is not intended to replace advice given to you by your health care provider. Make sure you discuss any questions you have with your health care provider.

## 2014-07-03 NOTE — Progress Notes (Signed)
Pre visit review using our clinic review tool, if applicable. No additional management support is needed unless otherwise documented below in the visit note.  Chief Complaint  Patient presents with  . Medicare Wellness    HPI: Patient comes in today for Preventive Medicare wellness visit . And medication  And condition management  No surgery's hosp since last visit  See below: Bp readings  Usually 140 range  Gets reading at fire department.  Last check  This week.   stopped the amlodipine for a few days ? If causing  beign down or such.  Right ear stopped up. ? Wax  Uses some q tips and also  otc irrigation Sleep still issue wakening   1-2 etoh per night continues scotch or wine No falling and no sig depression   Feels pretty healthy otherwise   Health Maintenance  Topic Date Due  . Zostavax  02/01/1993  . Colonoscopy  11/15/2013  . Influenza Vaccine  06/15/2014  . Tetanus/tdap  03/21/2016  . Pneumococcal Polysaccharide Vaccine Age 48 And Over  Completed   Health Maintenance Review  LIFESTYLE:  Exercise:   Walks daily  Tobacco/ETS: no Alcohol: 2 servings per day .  Sugar beverages: tea once a day.  splenda  Sleep:   Awakenings.  2 pm  Later.  Drug use: no Bone density:  Neg fam hx   Neg fracture  Colonoscopy:  utd  Aged out.    Hearing:  Dec right  Today ? Wax? Uses q tips and otc irrigation  Vision:  No limitations at present . Last eye check UTD surgery hx  Yearly check   Safety:  Has smoke detector and wears seat belts.  No firearms. No excess sun exposure. Sees dentist regularly.  Falls: no  Advance directive :  Reviewed  Has one.  Memory: Felt to be good  , no concern from her or her family.  Depression: No anhedonia unusual crying or depressive symptoms  Nutrition: Eats well balanced diet; adequate calcium and vitamin D. No swallowing chewing problems.    Injury: no major injuries in the last six months.  Other healthcare providers:  Reviewed today  .  Social:   Widowed   Preventive parameters: up-to-date  Reviewed   ADLS:   There are no problems or need for assistance  driving, feeding, obtaining food, dressing, toileting and bathing, managing money using phone. She is independent.  ROS:  GEN/ HEENT: No fever, significant weight changes sweats headaches glasses, CV/ PULM; No chest pain shortness of breath cough, syncope,edema  change in exercise tolerance. GI /GU: No adominal pain, vomiting, change in bowel habits. No blood in the stool. No significant GU symptoms. SKIN/HEME: ,no acute skin rashes suspicious lesions or bleeding. No lymphadenopathy, nodules, masses.  NEURO/ PSYCH:  No neurologic signs such as weakness numbness. No depression anxiety. ? down IMM/ Allergy: No unusual infections.  Allergy .   REST of 12 system review negative except as per HPI   Past Medical History  Diagnosis Date  . Allergic rhinitis   . Osteoarthritis   . Neutropenia     with elevated MCV no dx saw heme in the past neg rheum zieminski  . Spinal stenosis   . Osteopenia     mild dexa 2007 -0.7 hip sp nl    No family history on file.  History   Social History  . Marital Status: Married    Spouse Name: widowed     Number of Children: N/A  .  Years of Education: N/A   Occupational History  .     Social History Main Topics  . Smoking status: Never Smoker   . Smokeless tobacco: Never Used  . Alcohol Use: 4.2 oz/week    7 Glasses of wine per week  . Drug Use: No  . Sexual Activity: Not Currently   Other Topics Concern  . None   Social History Narrative   Retired   Widowed 7/04    [place] of [place]   Regular exercise- yes    Outpatient Encounter Prescriptions as of 07/03/2014  Medication Sig  . calcium-vitamin D (OSCAL WITH D) 500-200 MG-UNIT per tablet Take 1 tablet by mouth daily.    . cetirizine (ZYRTEC) 10 MG tablet Take 10 mg by mouth daily as needed.   . fluticasone (FLONASE) 50 MCG/ACT nasal spray USE 2 SPRAYS IN THE NOSE DAILY   . MULTIPLE VITAMIN PO Take by mouth.    . traZODone (DESYREL) 50 MG tablet Take 0.5-1 tablets (25-50 mg total) by mouth at bedtime as needed for sleep.  Marland Kitchen amLODipine (NORVASC) 2.5 MG tablet TAKE 1 TABLET BY MOUTH DAILY  . [DISCONTINUED] amLODipine (NORVASC) 2.5 MG tablet TAKE 1 TABLET BY MOUTH DAILY  . [DISCONTINUED] ciprofloxacin (CIPRO) 250 MG tablet Take 1 tablet (250 mg total) by mouth 2 (two) times daily.  . [DISCONTINUED] zolpidem (AMBIEN) 5 MG tablet Take 1 tablet (5 mg total) by mouth at bedtime as needed for sleep. Wean as possible    EXAM:  BP 140/78  Temp(Src) 98.2 F (36.8 C) (Oral)  Ht 5' 2.5" (1.588 m)  Wt 114 lb (51.71 kg)  BMI 20.51 kg/m2  Body mass index is 20.51 kg/(m^2).  Physical Exam: Vital signs reviewed RKY:HCWC is a well-developed well-nourished alert cooperative   who appears stated age in no acute distress.  HEENT: normocephalic atraumatic , Eyes: PERRL EOM's full, conjunctiva clear, Nares: paten,t no deformity discharge or tenderness., Ears: no deformity EAC's right with wax impacted irrigated TM intact left +2 wax not fully removed TM visualized superiorly . Mouth: clear OP, no lesions, edema.  Moist mucous membranes. Dentition in adequate repair. NECK: supple without masses, thyromegaly or bruits. CHEST/PULM:  Clear to auscultation and percussion breath sounds equal no wheeze , rales or rhonchi. No chest wall deformities or tenderness. Breast: normal by inspection . No dimpling, discharge, masses, tenderness or discharge . CV: PMI is nondisplaced, S1 S2 no gallops, murmurs, rubs. Peripheral pulses are full without delay.No JVD .  ABDOMEN: Bowel sounds normal nontender  No guard or rebound, no hepato splenomegal no CVA tenderness.  No hernia. Extremtities:  No clubbing cyanosis or edema, no acute joint swelling or redness no focal atrophy NEURO:  Oriented x3, cranial nerves 3-12 appear to be intact, no obvious focal weakness,gait within normal limits no  abnormal reflexes or asymmetrical SKIN: No acute rashes normal turgor, color, no bruising or petechiae.  Sun changes and aging.  PSYCH: Oriented, good eye contact, no obvious depression anxiety, cognition and judgment appear normal. LN: no cervical axillary inguinal adenopathy No noted deficits in memory, attention, and speech.   Lab Results  Component Value Date   WBC 3.9* 07/03/2014   HGB 13.4 07/03/2014   HCT 39.8 07/03/2014   PLT 253.0 07/03/2014   GLUCOSE 87 07/03/2014   CHOL 216* 11/10/2012   TRIG 137.0 11/10/2012   HDL 65.70 11/10/2012   LDLDIRECT 138.4 11/10/2012   ALT 14 11/10/2012   AST 18 11/10/2012   NA 140 07/03/2014  K 4.1 07/03/2014   CL 105 07/03/2014   CREATININE 0.9 07/03/2014   BUN 18 07/03/2014   CO2 29 07/03/2014   TSH 0.42 07/03/2014   HGBA1C 5.6 07/03/2014   BP Readings from Last 3 Encounters:  07/03/14 140/78  09/05/13 136/80  06/04/13 138/80    ASSESSMENT AND PLAN:  Discussed the following assessment and plan:  Visit for preventive health examination  Neutropenia, unspecified - Plan: Basic metabolic panel, CBC with Differential, TSH, Hemoglobin A1c  Essential hypertension - Plan: Basic metabolic panel, CBC with Differential, TSH, Hemoglobin A1c, T4, free  OSTEOARTHRITIS - Plan: Basic metabolic panel, CBC with Differential, TSH, Hemoglobin A1c  Insomnia, unspecified - Plan: Basic metabolic panel, CBC with Differential, TSH, Hemoglobin A1c, T4, free  Other and unspecified hyperlipidemia - usually good ratio - Plan: Basic metabolic panel, CBC with Differential, TSH, Hemoglobin A1c, T4, free  Hyperglycemia - hx random  - Plan: Basic metabolic panel, CBC with Differential, TSH, Hemoglobin A1c  Medicare annual wellness visit, subsequent  Need for vaccination with 13-polyvalent pneumococcal conjugate vaccine  Excess wax in ear, right - looks better after irrigation tm intact  left some wax use otc softening drops recheck prn or other  Malaise and  fatigue Patient feels she could be having a side effect of amlodipine low dose is been off a couple days uncertain if it's really related to her symptoms blood pressure is adequate today needs to be followed especially if he chooses to go off of it. It is not at goal she should go back on the medication and followup with [place] if she thinks she is having a side effect so we can switch medications.  Because of the fatigue symptoms we'll check thyroid tests today we'll recheck her CBC and she has had long term neutropenia previously evaluated by hematology no symptoms. Reviewed sleep hygiene would not encourage controlled substance for sleep discussed trial off of alcohol also. Patient Care Team: Burnis Medin, MD as PCP - General Jari Pigg, MD as Consulting Physician (Dermatology)  Patient Instructions  blood pressure goal is low140s and below . As long as doing well.    If you stop the amlodipine  You blood pressure may rise over the next month or so so need to continue to monitor  Continue lifestyle intervention healthy eating and exercise . Healthy lifestyle includes : At least 150 minutes of exercise weeks  , weight at healthy levels, which is usually   BMI 19-25. Avoid trans fats and processed foods;  Increase fresh fruits and veges to 5 servings per day. And avoid sweet beverages including tea and juice. Mediterranean diet with olive oil and nuts have been noted to be heart and brain healthy . Avoid tobacco products . Limit  alcohol to  7 per week for women and 14 servings for men.  Get adequate sleep . Can try  Off alcohol for a week and see if sleep some better .  Will notify you  of labs when available.     .  Hypertension Hypertension, commonly called high blood pressure, is when the force of blood pumping through your arteries is too strong. Your arteries are the blood vessels that carry blood from your heart throughout your body. A blood pressure reading consists of a higher  number over a lower number, such as 110/72. The higher number (systolic) is the pressure inside your arteries when your heart pumps. The lower number (diastolic) is the pressure inside your arteries when your heart  relaxes. Ideally you want your blood pressure below 120/80. Hypertension forces your heart to work harder to pump blood. Your arteries may become narrow or stiff. Having hypertension puts you at risk for heart disease, stroke, and other problems.  RISK FACTORS Some risk factors for high blood pressure are controllable. Others are not.  Risk factors you cannot control include:   Race. You may be at higher risk if you are African American.  Age. Risk increases with age.  Gender. Men are at higher risk than women before age 13 years. After age 12, women are at higher risk than men. Risk factors you can control include:  Not getting enough exercise or physical activity.  Being overweight.  Getting too much fat, sugar, calories, or salt in your diet.  Drinking too much alcohol. SIGNS AND SYMPTOMS Hypertension does not usually cause signs or symptoms. Extremely high blood pressure (hypertensive crisis) may cause headache, anxiety, shortness of breath, and nosebleed. DIAGNOSIS  To check if you have hypertension, your health care provider will measure your blood pressure while you are seated, with your arm held at the level of your heart. It should be measured at least twice using the same arm. Certain conditions can cause a difference in blood pressure between your right and left arms. A blood pressure reading that is higher than normal on one occasion does not mean that you need treatment. If one blood pressure reading is high, ask your health care provider about having it checked again. TREATMENT  Treating high blood pressure includes making lifestyle changes and possibly taking medicine. Living a healthy lifestyle can help lower high blood pressure. You may need to change some of your  habits. Lifestyle changes may include:  Following the DASH diet. This diet is high in fruits, vegetables, and whole grains. It is low in salt, red meat, and added sugars.  Getting at least 2 hours of brisk physical activity every week.  Losing weight if necessary.  Not smoking.  Limiting alcoholic beverages.  Learning ways to reduce stress. If lifestyle changes are not enough to get your blood pressure under control, your health care provider may prescribe medicine. You may need to take more than one. Work closely with your health care provider to understand the risks and benefits. HOME CARE INSTRUCTIONS  Have your blood pressure rechecked as directed by your health care provider.   Take medicines only as directed by your health care provider. Follow the directions carefully. Blood pressure medicines must be taken as prescribed. The medicine does not work as well when you skip doses. Skipping doses also puts you at risk for problems.   Do not smoke.   Monitor your blood pressure at home as directed by your health care provider. SEEK MEDICAL CARE IF:   You think you are having a reaction to medicines taken.  You have recurrent headaches or feel dizzy.  You have swelling in your ankles.  You have trouble with your vision. SEEK IMMEDIATE MEDICAL CARE IF:  You develop a severe headache or confusion.  You have unusual weakness, numbness, or feel faint.  You have severe chest or abdominal pain.  You vomit repeatedly.  You have trouble breathing. MAKE SURE YOU:   Understand these instructions.  Will watch your condition.  Will get help right away if you are not doing well or get worse. Document Released: 11/01/2005 Document Revised: 03/18/2014 Document Reviewed: 08/24/2013 [facility] Patient Information 2015 [place], [place]. This information is not intended to replace advice given  to you by your health care provider. Make sure you discuss any questions you have with  your health care provider.     Standley Brooking. [place] Devin M.D.

## 2014-07-03 NOTE — Telephone Encounter (Signed)
Relevant patient education mailed to patient.  

## 2015-01-14 ENCOUNTER — Other Ambulatory Visit: Payer: Self-pay | Admitting: Internal Medicine

## 2015-01-14 NOTE — Telephone Encounter (Signed)
Ok to refill x 3 

## 2015-01-15 NOTE — Telephone Encounter (Signed)
Sent to the pharmacy by e-scribe. 

## 2015-02-05 ENCOUNTER — Other Ambulatory Visit: Payer: Self-pay | Admitting: Dermatology

## 2015-02-18 ENCOUNTER — Other Ambulatory Visit: Payer: Self-pay | Admitting: Dermatology

## 2015-04-02 ENCOUNTER — Ambulatory Visit: Payer: Medicare HMO | Admitting: Internal Medicine

## 2015-07-07 ENCOUNTER — Other Ambulatory Visit: Payer: Self-pay | Admitting: Family Medicine

## 2015-07-07 ENCOUNTER — Ambulatory Visit (INDEPENDENT_AMBULATORY_CARE_PROVIDER_SITE_OTHER): Payer: Medicare HMO | Admitting: Internal Medicine

## 2015-07-07 ENCOUNTER — Encounter: Payer: Self-pay | Admitting: Internal Medicine

## 2015-07-07 ENCOUNTER — Telehealth: Payer: Self-pay

## 2015-07-07 VITALS — BP 150/68 | Temp 98.1°F | Ht 62.0 in | Wt 113.6 lb

## 2015-07-07 DIAGNOSIS — D7589 Other specified diseases of blood and blood-forming organs: Secondary | ICD-10-CM

## 2015-07-07 DIAGNOSIS — Z0000 Encounter for general adult medical examination without abnormal findings: Secondary | ICD-10-CM | POA: Diagnosis not present

## 2015-07-07 DIAGNOSIS — E875 Hyperkalemia: Secondary | ICD-10-CM

## 2015-07-07 DIAGNOSIS — I10 Essential (primary) hypertension: Secondary | ICD-10-CM

## 2015-07-07 DIAGNOSIS — D709 Neutropenia, unspecified: Secondary | ICD-10-CM

## 2015-07-07 LAB — CBC WITH DIFFERENTIAL/PLATELET
BASOS ABS: 0 10*3/uL (ref 0.0–0.1)
BASOS PCT: 0.3 % (ref 0.0–3.0)
EOS ABS: 0.2 10*3/uL (ref 0.0–0.7)
Eosinophils Relative: 3.6 % (ref 0.0–5.0)
HEMATOCRIT: 42.3 % (ref 36.0–46.0)
HEMOGLOBIN: 14.2 g/dL (ref 12.0–15.0)
LYMPHS PCT: 18.2 % (ref 12.0–46.0)
Lymphs Abs: 0.8 10*3/uL (ref 0.7–4.0)
MCHC: 33.6 g/dL (ref 30.0–36.0)
MCV: 107.3 fl — ABNORMAL HIGH (ref 78.0–100.0)
Monocytes Absolute: 0.3 10*3/uL (ref 0.1–1.0)
Monocytes Relative: 7.5 % (ref 3.0–12.0)
Neutro Abs: 3 10*3/uL (ref 1.4–7.7)
Neutrophils Relative %: 70.4 % (ref 43.0–77.0)
Platelets: 235 10*3/uL (ref 150.0–400.0)
RBC: 3.94 Mil/uL (ref 3.87–5.11)
RDW: 13.5 % (ref 11.5–15.5)
WBC: 4.3 10*3/uL (ref 4.0–10.5)

## 2015-07-07 LAB — LIPID PANEL
CHOL/HDL RATIO: 3
Cholesterol: 232 mg/dL — ABNORMAL HIGH (ref 0–200)
HDL: 75.3 mg/dL (ref >39.00)
LDL CALC: 134 mg/dL — AB (ref 0–99)
NONHDL: 156.49
Triglycerides: 110 mg/dL (ref 0.0–149.0)
VLDL: 22 mg/dL (ref 0.0–40.0)

## 2015-07-07 LAB — HEPATIC FUNCTION PANEL
ALK PHOS: 64 U/L (ref 39–117)
ALT: 12 U/L (ref 0–35)
AST: 17 U/L (ref 0–37)
Albumin: 4.4 g/dL (ref 3.5–5.2)
BILIRUBIN DIRECT: 0.1 mg/dL (ref 0.0–0.3)
TOTAL PROTEIN: 6.6 g/dL (ref 6.0–8.3)
Total Bilirubin: 0.7 mg/dL (ref 0.2–1.2)

## 2015-07-07 LAB — BASIC METABOLIC PANEL
BUN: 18 mg/dL (ref 6–23)
CHLORIDE: 108 meq/L (ref 96–112)
CO2: 29 mEq/L (ref 19–32)
CREATININE: 0.94 mg/dL (ref 0.40–1.20)
Calcium: 9.9 mg/dL (ref 8.4–10.5)
GFR: 60.53 mL/min (ref >60.00)
Glucose, Bld: 101 mg/dL — ABNORMAL HIGH (ref 70–99)
Potassium: 6.1 mEq/L (ref 3.5–5.1)
Sodium: 144 mEq/L (ref 135–145)

## 2015-07-07 LAB — TSH: TSH: 2.73 u[IU]/mL (ref 0.35–4.50)

## 2015-07-07 NOTE — Telephone Encounter (Signed)
CRITICAL VALUE STICKER  CRITICAL VALUE: Potassium 6.1   RECEIVER (INITALS):ACM   DATE & TIME NOTIFIED: July 07, 2015 at 2:47 pm  MD NOTIFIED: Panosh  TIME OF NOTIFICATION:2:48  RESPONSE:

## 2015-07-07 NOTE — Telephone Encounter (Signed)
See result note  For fu lab

## 2015-07-07 NOTE — Progress Notes (Signed)
Pre visit review using our clinic review tool, if applicable. No additional management support is needed unless otherwise documented below in the visit note.  Chief Complaint  Patient presents with  . Medicare Wellness    HPI: RASHAN PATIENT 81 y.o. comes in today for Preventive Medicare wellness visit .  since last visit. No major changes in health BP tends to be around 150 no recent readings . Taking  2.5 amlod  Back stable  No progression sx  No falling .  Commutes stays with [place] many parts of year. Independent adls  bp about  027/74  Health Maintenance  Topic Date Due  . ZOSTAVAX  02/01/1993  . DEXA SCAN  02/01/1998  . COLONOSCOPY  11/15/2013  . INFLUENZA VACCINE  06/16/2015  . TETANUS/TDAP  03/21/2016  . PNA vac Low Risk Adult  Completed   Health Maintenance Review LIFESTYLE:  Exercise:  Stretches   Tobacco/ETS:no Alcohol: per day 1-2 Sugar beverages: Sleep:takes alevepm  Rare use of trazadone Drug use: no MEDICARE DOCUMENT QUESTIONS  TO SCAN   Hearing:  Stable.     Vision:  No limitations at present . Last eye check UTD  Safety:  Has smoke detector and wears seat belts.  No firearms. No excess sun exposure. Sees dentist regularly.  Falls:  no  Advance directive :  Reviewed  Has one.  Memory: Felt to be good  Not as good  adls good.  , no concern from her or her family.  Depression: No anhedonia unusual crying or depressive symptoms sad  Down at times but no depression  Nutrition: Eats well balanced diet; adequate calcium and vitamin D. No swallowing chewing problems.  Injury: no major injuries in the last six months.  Other healthcare providers:  Reviewed today .  Social:  Lives widowed  No pets.   Preventive parameters: up-to-date  Reviewed   ADLS:   There are no problems or need for assistance  driving, feeding, obtaining food, dressing, toileting and bathing, managing money using phone. She is independent.  ROS:  GEN/ HEENT: No  fever, significant weight changes sweats headaches vision problems hearing changes, CV/ PULM; No chest pain shortness of breath cough, syncope,edema  change in exercise tolerance. GI /GU: No adominal pain, vomiting, change in bowel habits. No blood in the stool. No significant GU symptoms. SKIN/HEME: ,no acute skin rashes suspicious lesions or bleeding. No lymphadenopathy, nodules, masses.  NEURO/ PSYCH:  No neurologic signs such as weakness numbness. No depression anxiety. IMM/ Allergy: No unusual infections.  Allergy .   REST of 12 system review negative except as per HPI   Past Medical History  Diagnosis Date  . Allergic rhinitis   . Osteoarthritis   . Neutropenia     with elevated MCV no dx saw heme in the past neg rheum zieminski  . Spinal stenosis   . Osteopenia     mild dexa 2007 -0.7 hip sp nl    No family history on file.  Social History   Social History  . Marital Status: Married    Spouse Name: widowed   . Number of Children: N/A  . Years of Education: N/A   Occupational History  .     Social History Main Topics  . Smoking status: Never Smoker   . Smokeless tobacco: Never Used  . Alcohol Use: 4.2 oz/week    7 Glasses of wine per week  . Drug Use: No  . Sexual Activity: Not Currently  Other Topics Concern  . None   Social History Narrative   Retired   Widowed 7/04    [place] of 1   Regular exercise- yes    Outpatient Encounter Prescriptions as of 07/07/2015  Medication Sig  . amLODipine (NORVASC) 2.5 MG tablet TAKE 1 TABLET BY MOUTH DAILY  . calcium-vitamin D (OSCAL WITH D) 500-200 MG-UNIT per tablet Take 1 tablet by mouth daily.    . cetirizine (ZYRTEC) 10 MG tablet Take 10 mg by mouth daily as needed.   . fluticasone (FLONASE) 50 MCG/ACT nasal spray USE 2 SPRAYS IN THE NOSE DAILY  . MULTIPLE VITAMIN PO Take by mouth.    . traZODone (DESYREL) 50 MG tablet TAKE 1/2 - 1 TABLETS BY MOUTH AT [facility] NEEDED FOR SLEEP   No facility-administered encounter  medications on file as of 07/07/2015.    EXAM:  BP 150/68 mmHg  Temp(Src) 98.1 F (36.7 C) (Oral)  Ht 5\' 2"  (1.575 m)  Wt 113 lb 9.6 oz (51.529 kg)  BMI 20.77 kg/m2  Body mass index is 20.77 kg/(m^2).  Physical Exam: Vital signs reviewed OEV:OJJK is a well-developed well-nourished alert cooperative   who appears stated age in no acute distress.  HEENT: normocephalic atraumatic , Eyes: PERRL EOM's full, conjunctiva clear, Nares: paten,t no deformity discharge or tenderness., Ears: no deformity EAC's clear right left wax  TMs with normal landmarks. Mouth: clear OP, no lesions, edema.  Moist mucous membranes.  NECK: supple without masses, thyromegaly or bruits. CHEST/PULM:  Clear to auscultation and percussion breath sounds equal no wheeze , rales or rhonchi. No chest wall deformities or tenderness.Breast: normal by inspection . No dimpling, discharge, masses, tenderness or discharge . CV: PMI is nondisplaced, S1 S2 no gallops, murmurs, rubs. Peripheral pulses are full without delay.No JVD .  ABDOMEN: Bowel sounds normal nontender  No guard or rebound, no hepato splenomegal no CVA tenderness.  No hernia. Extremtities:  No clubbing cyanosis or edema, no acute joint swelling or redness no focal atrophy  Some OA changes in hands  NEURO:  Oriented x3, cranial nerves 3-12 appear to be intact, no obvious focal weakness,gait within normal limits no abnormal reflexes or asymmetrical SKIN: No acute rashes normal turgor, color, no bruising or petechiae. Many sun changes no suspicious lesions  obvious  PSYCH: Oriented, good eye contact, no obvious depression anxiety, cognition and judgment appear normal. LN: no cervical axillary inguinal adenopathy No noted deficits in memory, attention, and speech.   Lab Results  Component Value Date   WBC 3.9* 07/03/2014   HGB 13.4 07/03/2014   HCT 39.8 07/03/2014   PLT 253.0 07/03/2014   GLUCOSE 87 07/03/2014   CHOL 216* 11/10/2012   TRIG 137.0 11/10/2012     HDL 65.70 11/10/2012   LDLDIRECT 138.4 11/10/2012   ALT 14 11/10/2012   AST 18 11/10/2012   NA 140 07/03/2014   K 4.1 07/03/2014   CL 105 07/03/2014   CREATININE 0.9 07/03/2014   BUN 18 07/03/2014   CO2 29 07/03/2014   TSH 0.42 07/03/2014   HGBA1C 5.6 07/03/2014    ASSESSMENT AND PLAN:  Discussed the following assessment and plan:  Visit for preventive health examination - Plan: Basic metabolic panel, CBC with Differential/Platelet, Hepatic function panel, Lipid panel, TSH  Medicare annual wellness visit, subsequent  Neutropenia - Plan: Basic metabolic panel, CBC with Differential/Platelet, Hepatic function panel, Lipid panel, TSH  Essential hypertension - Plan: Basic metabolic panel, CBC with Differential/Platelet, Hepatic function panel, Lipid panel, TSH  Lab  Ask about dexa  Patient Care Team: Burnis Medin, MD as PCP - General Jari Pigg, MD as Consulting Physician (Dermatology)  Patient Instructions  Continue lifestyle intervention healthy eating and exercise .  Goal bp  Below 145    Take blood pressure readings twice a day for 10 - 14 days and then periodically .To ensure below 145/90   .Send in readings     Ov if not controlled .  Yearly flu vaccine .   Healthy lifestyle includes : At least 150 minutes of exercise weeks  , weight at healthy levels, which is usually   BMI 19-25. Avoid trans fats and processed foods;  Increase fresh fruits and veges to 5 servings per day. And avoid sweet beverages including tea and juice. Mediterranean diet with olive oil and nuts have been noted to be heart and brain healthy . Avoid tobacco products . Limit  alcohol to  7 per week for women and 14 servings for men.  Get adequate sleep . Wear seat belts . Don't text and drive .  Fall Prevention and Home Safety Falls cause injuries and can affect all age groups. It is possible to use preventive measures to significantly decrease the likelihood of falls. There are many simple  measures which can make your home safer and prevent falls. OUTDOORS  Repair cracks and edges of walkways and driveways.  Remove high doorway thresholds.  Trim shrubbery on the main path into your home.  Have good outside lighting.  Clear walkways of tools, rocks, debris, and clutter.  Check that handrails are not broken and are securely fastened. Both sides of steps should have handrails.  Have leaves, snow, and ice cleared regularly.  Use sand or salt on walkways during winter months.  In the garage, clean up grease or oil spills. BATHROOM  Install night lights.  Install grab bars by the toilet and in the tub and shower.  Use non-skid mats or decals in the tub or shower.  Place a plastic non-slip stool in the shower to sit on, if needed.  Keep floors dry and clean up all water on the floor immediately.  Remove soap buildup in the tub or shower on a regular basis.  Secure bath mats with non-slip, double-sided rug tape.  Remove throw rugs and tripping hazards from the floors. BEDROOMS  Install night lights.  Make sure a bedside light is easy to reach.  Do not use oversized bedding.  Keep a telephone by your bedside.  Have a firm chair with side arms to use for getting dressed.  Remove throw rugs and tripping hazards from the floor. KITCHEN  Keep handles on pots and pans turned toward the center of the stove. Use back burners when possible.  Clean up spills quickly and allow time for drying.  Avoid walking on wet floors.  Avoid hot utensils and knives.  Position shelves so they are not too high or low.  Place commonly used objects within easy reach.  If necessary, use a sturdy step stool with a grab bar when reaching.  Keep electrical cables out of the way.  Do not use floor polish or wax that makes floors slippery. If you must use wax, use non-skid floor wax.  Remove throw rugs and tripping hazards from the floor. STAIRWAYS  Never leave objects  on stairs.  Place handrails on both sides of stairways and use them. Fix any loose handrails. Make sure handrails on both sides of the stairways are as  long as the stairs.  Check carpeting to make sure it is firmly attached along stairs. Make repairs to worn or loose carpet promptly.  Avoid placing throw rugs at the top or bottom of stairways, or properly secure the rug with carpet tape to prevent slippage. Get rid of throw rugs, if possible.  Have an electrician put in a light switch at the top and bottom of the stairs. OTHER FALL PREVENTION TIPS  Wear low-heel or rubber-soled shoes that are supportive and fit well. Wear closed toe shoes.  When using a stepladder, make sure it is fully opened and both spreaders are firmly locked. Do not climb a closed stepladder.  Add color or contrast paint or tape to grab bars and handrails in your home. Place contrasting color strips on first and last steps.  Learn and use mobility aids as needed. Install an electrical emergency response system.  Turn on lights to avoid dark areas. Replace light bulbs that burn out immediately. Get light switches that glow.  Arrange furniture to create clear pathways. Keep furniture in the same place.  Firmly attach carpet with non-skid or double-sided tape.  Eliminate uneven floor surfaces.  Select a carpet pattern that does not visually hide the edge of steps.  Be aware of all pets. OTHER HOME SAFETY TIPS  Set the water temperature for 120 F (48.8 C).  Keep emergency numbers on or near the telephone.  Keep smoke detectors on every level of the home and near sleeping areas. Document Released: 10/22/2002 Document Revised: 05/02/2012 Document Reviewed: 01/21/2012 [facility] Patient Information 2015 [place], [place]. This information is not intended to replace advice given to you by your health care provider. Make sure you discuss any questions you have with your health care provider.      Standley Brooking. Panosh  M.D.

## 2015-07-07 NOTE — Telephone Encounter (Signed)
See lab result note.

## 2015-07-07 NOTE — Patient Instructions (Signed)
Continue lifestyle intervention healthy eating and exercise .  Goal bp  Below 145    Take blood pressure readings twice a day for 10 - 14 days and then periodically .To ensure below 145/90   .Send in readings     Ov if not controlled .  Yearly flu vaccine .   Healthy lifestyle includes : At least 150 minutes of exercise weeks  , weight at healthy levels, which is usually   BMI 19-25. Avoid trans fats and processed foods;  Increase fresh fruits and veges to 5 servings per day. And avoid sweet beverages including tea and juice. Mediterranean diet with olive oil and nuts have been noted to be heart and brain healthy . Avoid tobacco products . Limit  alcohol to  7 per week for women and 14 servings for men.  Get adequate sleep . Wear seat belts . Don't text and drive .  Fall Prevention and Home Safety Falls cause injuries and can affect all age groups. It is possible to use preventive measures to significantly decrease the likelihood of falls. There are many simple measures which can make your home safer and prevent falls. OUTDOORS  Repair cracks and edges of walkways and driveways.  Remove high doorway thresholds.  Trim shrubbery on the main path into your home.  Have good outside lighting.  Clear walkways of tools, rocks, debris, and clutter.  Check that handrails are not broken and are securely fastened. Both sides of steps should have handrails.  Have leaves, snow, and ice cleared regularly.  Use sand or salt on walkways during winter months.  In the garage, clean up grease or oil spills. BATHROOM  Install night lights.  Install grab bars by the toilet and in the tub and shower.  Use non-skid mats or decals in the tub or shower.  Place a plastic non-slip stool in the shower to sit on, if needed.  Keep floors dry and clean up all water on the floor immediately.  Remove soap buildup in the tub or shower on a regular basis.  Secure bath mats with non-slip, double-sided  rug tape.  Remove throw rugs and tripping hazards from the floors. BEDROOMS  Install night lights.  Make sure a bedside light is easy to reach.  Do not use oversized bedding.  Keep a telephone by your bedside.  Have a firm chair with side arms to use for getting dressed.  Remove throw rugs and tripping hazards from the floor. KITCHEN  Keep handles on pots and pans turned toward the center of the stove. Use back burners when possible.  Clean up spills quickly and allow time for drying.  Avoid walking on wet floors.  Avoid hot utensils and knives.  Position shelves so they are not too high or low.  Place commonly used objects within easy reach.  If necessary, use a sturdy step stool with a grab bar when reaching.  Keep electrical cables out of the way.  Do not use floor polish or wax that makes floors slippery. If you must use wax, use non-skid floor wax.  Remove throw rugs and tripping hazards from the floor. STAIRWAYS  Never leave objects on stairs.  Place handrails on both sides of stairways and use them. Fix any loose handrails. Make sure handrails on both sides of the stairways are as long as the stairs.  Check carpeting to make sure it is firmly attached along stairs. Make repairs to worn or loose carpet promptly.  Avoid placing throw rugs  at the top or bottom of stairways, or properly secure the rug with carpet tape to prevent slippage. Get rid of throw rugs, if possible.  Have an electrician put in a light switch at the top and bottom of the stairs. OTHER FALL PREVENTION TIPS  Wear low-heel or rubber-soled shoes that are supportive and fit well. Wear closed toe shoes.  When using a stepladder, make sure it is fully opened and both spreaders are firmly locked. Do not climb a closed stepladder.  Add color or contrast paint or tape to grab bars and handrails in your home. Place contrasting color strips on first and last steps.  Learn and use mobility aids as  needed. Install an electrical emergency response system.  Turn on lights to avoid dark areas. Replace light bulbs that burn out immediately. Get light switches that glow.  Arrange furniture to create clear pathways. Keep furniture in the same place.  Firmly attach carpet with non-skid or double-sided tape.  Eliminate uneven floor surfaces.  Select a carpet pattern that does not visually hide the edge of steps.  Be aware of all pets. OTHER HOME SAFETY TIPS  Set the water temperature for 120 F (48.8 C).  Keep emergency numbers on or near the telephone.  Keep smoke detectors on every level of the home and near sleeping areas. Document Released: 10/22/2002 Document Revised: 05/02/2012 Document Reviewed: 01/21/2012 [facility] Patient Information 2015 [place], [place]. This information is not intended to replace advice given to you by your health care provider. Make sure you discuss any questions you have with your health care provider.

## 2015-08-28 ENCOUNTER — Other Ambulatory Visit: Payer: Self-pay | Admitting: Internal Medicine

## 2015-08-28 NOTE — Telephone Encounter (Signed)
Sent to the pharmacy by e-scribe. 

## 2016-01-20 DIAGNOSIS — G479 Sleep disorder, unspecified: Secondary | ICD-10-CM | POA: Diagnosis not present

## 2016-01-20 DIAGNOSIS — N183 Chronic kidney disease, stage 3 (moderate): Secondary | ICD-10-CM | POA: Diagnosis not present

## 2016-01-20 DIAGNOSIS — I10 Essential (primary) hypertension: Secondary | ICD-10-CM | POA: Diagnosis not present

## 2016-01-20 DIAGNOSIS — D7589 Other specified diseases of blood and blood-forming organs: Secondary | ICD-10-CM | POA: Diagnosis not present

## 2016-01-23 ENCOUNTER — Encounter: Payer: Self-pay | Admitting: Hematology

## 2016-01-26 ENCOUNTER — Telehealth: Payer: Self-pay | Admitting: Hematology

## 2016-01-26 NOTE — Telephone Encounter (Signed)
Completed intake for upcoming appt 3/17

## 2016-01-26 NOTE — Telephone Encounter (Signed)
Completed pt intake Verified insurance and address

## 2016-01-27 ENCOUNTER — Telehealth: Payer: Self-pay | Admitting: Hematology

## 2016-01-27 NOTE — Telephone Encounter (Signed)
Patient is aware of new arrive time 11:30am

## 2016-01-30 ENCOUNTER — Encounter: Payer: Self-pay | Admitting: Hematology

## 2016-01-30 ENCOUNTER — Other Ambulatory Visit (HOSPITAL_BASED_OUTPATIENT_CLINIC_OR_DEPARTMENT_OTHER): Payer: Medicare HMO

## 2016-01-30 ENCOUNTER — Ambulatory Visit (HOSPITAL_BASED_OUTPATIENT_CLINIC_OR_DEPARTMENT_OTHER): Payer: Medicare HMO | Admitting: Hematology

## 2016-01-30 ENCOUNTER — Telehealth: Payer: Self-pay | Admitting: Hematology

## 2016-01-30 VITALS — BP 162/67 | HR 71 | Temp 97.6°F | Resp 18 | Wt 116.3 lb

## 2016-01-30 DIAGNOSIS — D7589 Other specified diseases of blood and blood-forming organs: Secondary | ICD-10-CM

## 2016-01-30 LAB — CBC & DIFF AND RETIC
BASO%: 0.2 % (ref 0.0–2.0)
BASOS ABS: 0 10*3/uL (ref 0.0–0.1)
EOS ABS: 0.1 10*3/uL (ref 0.0–0.5)
EOS%: 2.3 % (ref 0.0–7.0)
HEMATOCRIT: 40.3 % (ref 34.8–46.6)
HEMOGLOBIN: 13.6 g/dL (ref 11.6–15.9)
Immature Retic Fract: 3.4 % (ref 1.60–10.00)
LYMPH%: 16.5 % (ref 14.0–49.7)
MCH: 36 pg — AB (ref 25.1–34.0)
MCHC: 33.7 g/dL (ref 31.5–36.0)
MCV: 106.6 fL — ABNORMAL HIGH (ref 79.5–101.0)
MONO#: 0.3 10*3/uL (ref 0.1–0.9)
MONO%: 5.6 % (ref 0.0–14.0)
NEUT#: 3.6 10*3/uL (ref 1.5–6.5)
NEUT%: 75.4 % (ref 38.4–76.8)
PLATELETS: 222 10*3/uL (ref 145–400)
RBC: 3.78 10*6/uL (ref 3.70–5.45)
RDW: 12.8 % (ref 11.2–14.5)
RETIC %: 1.16 % (ref 0.70–2.10)
Retic Ct Abs: 43.85 10*3/uL (ref 33.70–90.70)
WBC: 4.8 10*3/uL (ref 3.9–10.3)
lymph#: 0.8 10*3/uL — ABNORMAL LOW (ref 0.9–3.3)

## 2016-01-30 LAB — TECHNOLOGIST REVIEW

## 2016-01-30 LAB — CHCC SMEAR

## 2016-01-30 NOTE — Progress Notes (Addendum)
[place]  [phone] [phone]  Clinic New consult Note   Patient Care Team: Leighton Ruff, MD as PCP - General (Family Medicine) Jari Pigg, MD as Consulting Physician (Dermatology) 01/30/2016  REFERRAL PHYSICIAN: Gerrit Heck, MD   CHIEF COMPLAINTS/PURPOSE OF CONSULTATION:  Macrocytosis without anemia  HISTORY OF PRESENTING ILLNESS:  Kelly Wolfe 82 y.o. female is here because of her abnormal MCV on lab test. She was referred by her primary care physician Dr. Drema Dallas.   She denies any history of anemia. She did have blood donation several times, last one was 25 years ago. She never had a blood transfusion. She was noticed to have elevated MCV on her routine lab CBC since at least 2008 (per [facility] records), with MCV 102-110. No anemia, she had intermittent mild leukopenia, bili has been normal. She has not seen a hematologist before.  She is pretty healthy, widowed, lives alone, independent, feels well, she has mild low back discomort, does not need any medication. She denies any other pain, dyspnea, abdominal discomfort, or other symptoms. She states her energy level has been lower than before, but she is able to tolerate routine activities without difficulty. She has been cooking less in the past few years, but her diet has been pretty balanced, she eats but it will, meat, fruits etc. She does take a multivitamin once daily also.  She is retired, used to work for [employer] She has three children, one son lives close to her and comes in to have dinner with her frequently.   MEDICAL HISTORY:  Past Medical History  Diagnosis Date  . Allergic rhinitis   . Osteoarthritis   . Neutropenia     with elevated MCV no dx saw heme in the past neg rheum zieminski  . Spinal stenosis   . Osteopenia     mild dexa 2007 -0.7 hip sp nl    SURGICAL HISTORY: Past Surgical History  Procedure Laterality Date  . Abdominal hysterectomy     total  . Rotator cuff repair  01/08    right    SOCIAL HISTORY: Social History   Social History  . Marital Status: Married    Spouse Name: widowed   . Number of Children: N/A  . Years of Education: N/A   Occupational History  .     Social History Main Topics  . Smoking status: Never Smoker   . Smokeless tobacco: Never Used  . Alcohol Use: 4.2 oz/week    7 Glasses of wine per week     Comment: cocktail in afternoon, daily   . Drug Use: No  . Sexual Activity: Not Currently   Other Topics Concern  . Not on file   Social History Narrative   Retired   Widowed 7/04    [place] of 1   Regular exercise- yes    FAMILY HISTORY: History reviewed. No pertinent family history.  ALLERGIES:  is allergic to lisinopril and prednisone.  MEDICATIONS:  Current Outpatient Prescriptions  Medication Sig Dispense Refill  . amLODipine (NORVASC) 2.5 MG tablet TAKE 1 TABLET BY MOUTH DAILY 90 tablet 2  . calcium-vitamin D (OSCAL WITH D) 500-200 MG-UNIT per tablet Take 1 tablet by mouth daily.      . cetirizine (ZYRTEC) 10 MG tablet Take 10 mg by mouth daily as needed.     . fluticasone (FLONASE) 50 MCG/ACT nasal spray USE 2 SPRAYS IN THE NOSE DAILY 16 g 2  . MULTIPLE VITAMIN PO Take by mouth.      Marland Kitchen  Vitamins-Lipotropics (LIPOFLAVONOID PO) Take 1 tablet by mouth daily.    . traZODone (DESYREL) 50 MG tablet TAKE 1/2 - 1 TABLETS BY MOUTH AT [facility] NEEDED FOR SLEEP (Patient not taking: Reported on 01/30/2016) 30 tablet 2   No current facility-administered medications for this visit.    REVIEW OF SYSTEMS:   Constitutional: Denies fevers, chills or abnormal night sweats Eyes: Denies blurriness of vision, double vision or watery eyes Ears, nose, mouth, throat, and face: Denies mucositis or sore throat Respiratory: Denies cough, dyspnea or wheezes Cardiovascular: Denies palpitation, chest discomfort or lower extremity swelling Gastrointestinal:  Denies nausea, heartburn or change in bowel  habits Skin: Denies abnormal skin rashes Lymphatics: Denies new lymphadenopathy or easy bruising Neurological:Denies numbness, tingling or new weaknesses Behavioral/Psych: Mood is stable, no new changes  All other systems were reviewed with the patient and are negative.  PHYSICAL EXAMINATION: ECOG PERFORMANCE STATUS: 0 - Asymptomatic  Filed Vitals:   01/30/16 1123  BP: 162/67  Pulse: 71  Temp: 97.6 F (36.4 C)  Resp: 18   Filed Weights   01/30/16 1123  Weight: 116 lb 4.8 oz (52.753 kg)    GENERAL:alert, no distress and comfortable SKIN: skin color, texture, turgor are normal, no rashes or significant lesions EYES: normal, conjunctiva are pink and non-injected, sclera clear OROPHARYNX:no exudate, no erythema and lips, buccal mucosa, and tongue normal  NECK: supple, thyroid normal size, non-tender, without nodularity LYMPH:  no palpable lymphadenopathy in the cervical, axillary or inguinal LUNGS: clear to auscultation and percussion with normal breathing effort HEART: regular rate & rhythm and no murmurs and no lower extremity edema ABDOMEN:abdomen soft, non-tender and normal bowel sounds Musculoskeletal:no cyanosis of digits and no clubbing  PSYCH: alert & oriented x 3 with fluent speech NEURO: no focal motor/sensory deficits  LABORATORY DATA:  I have reviewed the data as listed CBC Latest Ref Rng 01/30/2016 07/07/2015 07/03/2014  WBC 3.9 - 10.3 10e3/uL 4.8 4.3 3.9(L)  Hemoglobin 11.6 - 15.9 g/dL 13.6 14.2 13.4  Hematocrit 34.8 - 46.6 % 40.3 42.3 39.8  Platelets 145 - 400 10e3/uL 222 235.0 253.0    CMP Latest Ref Rng 07/07/2015 07/03/2014 11/10/2012  Glucose 70 - 99 mg/dL 101(H) 87 165(H)  BUN 6 - 23 mg/dL 18 18 18   Creatinine 0.40 - 1.20 mg/dL 0.94 0.9 0.9  Sodium 135 - 145 mEq/L 144 140 139  Potassium 3.5 - 5.1 mEq/L 6.1(HH) 4.1 3.9  Chloride 96 - 112 mEq/L 108 105 102  CO2 19 - 32 mEq/L 29 29 27   Calcium 8.4 - 10.5 mg/dL 9.9 9.1 9.9  Total Protein 6.0 - 8.3 g/dL 6.6  - 7.0  Total Bilirubin 0.2 - 1.2 mg/dL 0.7 - 0.6  Alkaline Phos 39 - 117 U/L 64 - 59  AST 0 - 37 U/L 17 - 18  ALT 0 - 35 U/L 12 - 14   CBC    Component Value Date/Time   WBC 4.8 01/30/2016 1316   WBC 4.3 07/07/2015 1018   RBC 3.78 01/30/2016 1316   RBC 3.94 07/07/2015 1018   HGB 13.6 01/30/2016 1316   HGB 14.2 07/07/2015 1018   HCT 40.3 01/30/2016 1316   HCT 42.3 07/07/2015 1018   PLT 222 01/30/2016 1316   PLT 235.0 07/07/2015 1018   MCV 106.6* 01/30/2016 1316   MCV 107.3* 07/07/2015 1018   MCH 36.0* 01/30/2016 1316   MCHC 33.7 01/30/2016 1316   MCHC 33.6 07/07/2015 1018   RDW 12.8 01/30/2016 1316   RDW  13.5 07/07/2015 1018   LYMPHSABS 0.8* 01/30/2016 1316   LYMPHSABS 0.8 07/07/2015 1018   MONOABS 0.3 01/30/2016 1316   MONOABS 0.3 07/07/2015 1018   EOSABS 0.1 01/30/2016 1316   EOSABS 0.2 07/07/2015 1018   BASOSABS 0.0 01/30/2016 1316   BASOSABS 0.0 07/07/2015 1018   retic ct abs: 43.85  RADIOGRAPHIC STUDIES: I have personally reviewed the radiological images as listed and agreed with the findings in the report. No results found.  ASSESSMENT & PLAN:  82 yo female with PMH of arthritis otherwise healthy and fit, presented with chronic microcytosis without anemia.   1. Macrocytosis without anemia  -She has had elevated MCV in the range of 100-110 for at least 9 years, overall stable.  -She did have mild intermittent leukopenia, the rest of CBC has been normal.  -I reviewed her prior and outside lab results, folate and vitamin B 12 level has been checked multiple times in the past, all were within normal limits  -TSH has been normal -I reviewed the possible etiology of macrocytosis, which includes but not limited to M14 or folic acid deficiency, reticulocyte ptosis, medication induced, liver disease, alcohol, and bone marrow disease.  -I will check methylmalonic acid level, which is a more sensitive marker for J09 or folic acid deficiency  -She does drink alcohol  moderately, I suggest her to stop drinking completely for 3 months, to see if her macrocytosis improves  -She does not take any medication which can cause macrocytosis  -Bone marrow disease, especially low risk MDS, is certainly a possibility, giving her concurrent mild leukopenia. However her macrocytosis and mild leukopenia has been very stable over 9 years, makes it less likely. Given her advanced age, asymptomatic, I do not feel we need to pursue bone marrow biopsy at this point. Even if she had low risk MDS, she would not require any treatment giving her normal blood counts.  -I'll review her peripheral blood smear, to see if there are any signs of abnormal morphology of her blood cells. -At the end of the conversation, I recommend her follow up clinically.   Plan -will repeat a CBC, differential and reticulocyte count, check methylmalonic level today, and I'll review her peripheral blood smear -follow up with her PCP in about 6 months, and me in 1 year with CBC, diff and rect.   All questions were answered. The patient knows to call the clinic with any problems, questions or concerns. I spent 30 minutes counseling the patient face to face. The total time spent in the appointment was 40 minutes and more than 50% was on counseling.     Truitt Merle, MD 01/30/2016 1:09 PM  Addendum I have reviewed her  Peripheral smear with pathologist Dr. Gari Crown today.  Her CBC and differential are normal, morphology  Of RBC, WBC and platelets appear to be normal and uniform,  No signs of  Dyspoiesis or dysgranulation. Her lab tests of methylmalonic acid level were normal. I called the patient and informed about results. I do not think she needs further testing at this point. I'll see her back in one year  Truitt Merle  02/17/2016

## 2016-01-30 NOTE — Telephone Encounter (Signed)
Gave and printed appt sched and avs for pt for march 2018 °

## 2016-02-03 LAB — METHYLMALONIC ACID, SERUM: Methylmalonic Acid, Serum: 133 nmol/L (ref 0–378)

## 2016-02-11 DIAGNOSIS — Z85820 Personal history of malignant melanoma of skin: Secondary | ICD-10-CM | POA: Diagnosis not present

## 2016-02-11 DIAGNOSIS — L570 Actinic keratosis: Secondary | ICD-10-CM | POA: Diagnosis not present

## 2016-02-11 DIAGNOSIS — D2272 Melanocytic nevi of left lower limb, including hip: Secondary | ICD-10-CM | POA: Diagnosis not present

## 2016-02-11 DIAGNOSIS — L821 Other seborrheic keratosis: Secondary | ICD-10-CM | POA: Diagnosis not present

## 2016-02-11 DIAGNOSIS — D485 Neoplasm of uncertain behavior of skin: Secondary | ICD-10-CM | POA: Diagnosis not present

## 2016-02-11 DIAGNOSIS — L720 Epidermal cyst: Secondary | ICD-10-CM | POA: Diagnosis not present

## 2016-02-11 DIAGNOSIS — Z85828 Personal history of other malignant neoplasm of skin: Secondary | ICD-10-CM | POA: Diagnosis not present

## 2016-02-11 DIAGNOSIS — Z23 Encounter for immunization: Secondary | ICD-10-CM | POA: Diagnosis not present

## 2016-02-11 DIAGNOSIS — L723 Sebaceous cyst: Secondary | ICD-10-CM | POA: Diagnosis not present

## 2016-02-12 DIAGNOSIS — Z0100 Encounter for examination of eyes and vision without abnormal findings: Secondary | ICD-10-CM | POA: Diagnosis not present

## 2016-02-12 DIAGNOSIS — H5212 Myopia, left eye: Secondary | ICD-10-CM | POA: Diagnosis not present

## 2016-03-31 DIAGNOSIS — D2272 Melanocytic nevi of left lower limb, including hip: Secondary | ICD-10-CM | POA: Diagnosis not present

## 2016-03-31 DIAGNOSIS — D489 Neoplasm of uncertain behavior, unspecified: Secondary | ICD-10-CM | POA: Diagnosis not present

## 2016-04-02 DIAGNOSIS — H9311 Tinnitus, right ear: Secondary | ICD-10-CM | POA: Diagnosis not present

## 2016-04-02 DIAGNOSIS — H6123 Impacted cerumen, bilateral: Secondary | ICD-10-CM | POA: Diagnosis not present

## 2016-07-12 DIAGNOSIS — N183 Chronic kidney disease, stage 3 (moderate): Secondary | ICD-10-CM | POA: Diagnosis not present

## 2016-07-12 DIAGNOSIS — Z23 Encounter for immunization: Secondary | ICD-10-CM | POA: Diagnosis not present

## 2016-07-12 DIAGNOSIS — D7589 Other specified diseases of blood and blood-forming organs: Secondary | ICD-10-CM | POA: Diagnosis not present

## 2016-07-12 DIAGNOSIS — I10 Essential (primary) hypertension: Secondary | ICD-10-CM | POA: Diagnosis not present

## 2016-08-13 DIAGNOSIS — Z0000 Encounter for general adult medical examination without abnormal findings: Secondary | ICD-10-CM | POA: Diagnosis not present

## 2016-08-13 DIAGNOSIS — I10 Essential (primary) hypertension: Secondary | ICD-10-CM | POA: Diagnosis not present

## 2016-08-13 DIAGNOSIS — R69 Illness, unspecified: Secondary | ICD-10-CM | POA: Diagnosis not present

## 2016-08-25 DIAGNOSIS — Z86008 Personal history of in-situ neoplasm of other site: Secondary | ICD-10-CM | POA: Diagnosis not present

## 2016-08-25 DIAGNOSIS — L821 Other seborrheic keratosis: Secondary | ICD-10-CM | POA: Diagnosis not present

## 2016-08-25 DIAGNOSIS — D485 Neoplasm of uncertain behavior of skin: Secondary | ICD-10-CM | POA: Diagnosis not present

## 2016-08-25 DIAGNOSIS — C44612 Basal cell carcinoma of skin of right upper limb, including shoulder: Secondary | ICD-10-CM | POA: Diagnosis not present

## 2016-08-25 DIAGNOSIS — Z85828 Personal history of other malignant neoplasm of skin: Secondary | ICD-10-CM | POA: Diagnosis not present

## 2016-08-25 DIAGNOSIS — Z23 Encounter for immunization: Secondary | ICD-10-CM | POA: Diagnosis not present

## 2016-08-25 DIAGNOSIS — D2371 Other benign neoplasm of skin of right lower limb, including hip: Secondary | ICD-10-CM | POA: Diagnosis not present

## 2016-09-14 DIAGNOSIS — Z23 Encounter for immunization: Secondary | ICD-10-CM | POA: Diagnosis not present

## 2016-09-14 DIAGNOSIS — C44519 Basal cell carcinoma of skin of other part of trunk: Secondary | ICD-10-CM | POA: Diagnosis not present

## 2016-12-22 DIAGNOSIS — Z0000 Encounter for general adult medical examination without abnormal findings: Secondary | ICD-10-CM | POA: Diagnosis not present

## 2016-12-22 DIAGNOSIS — I10 Essential (primary) hypertension: Secondary | ICD-10-CM | POA: Diagnosis not present

## 2016-12-22 DIAGNOSIS — Z6820 Body mass index (BMI) 20.0-20.9, adult: Secondary | ICD-10-CM | POA: Diagnosis not present

## 2016-12-22 DIAGNOSIS — Z79899 Other long term (current) drug therapy: Secondary | ICD-10-CM | POA: Diagnosis not present

## 2016-12-22 DIAGNOSIS — Z85820 Personal history of malignant melanoma of skin: Secondary | ICD-10-CM | POA: Diagnosis not present

## 2017-01-13 DIAGNOSIS — D7589 Other specified diseases of blood and blood-forming organs: Secondary | ICD-10-CM | POA: Diagnosis not present

## 2017-01-13 DIAGNOSIS — I10 Essential (primary) hypertension: Secondary | ICD-10-CM | POA: Diagnosis not present

## 2017-01-13 DIAGNOSIS — Z13220 Encounter for screening for lipoid disorders: Secondary | ICD-10-CM | POA: Diagnosis not present

## 2017-01-13 DIAGNOSIS — N183 Chronic kidney disease, stage 3 (moderate): Secondary | ICD-10-CM | POA: Diagnosis not present

## 2017-01-24 ENCOUNTER — Telehealth: Payer: Self-pay

## 2017-01-24 NOTE — Telephone Encounter (Signed)
Let pt know Dr Ernestina Penna message. Instructed her to call or have Dr Drema Dallas call if she needs to be seen, no future appts at [facility] as of this time. Pt was agreeable.

## 2017-01-24 NOTE — Telephone Encounter (Signed)
That's fine, let her know we are available for her if needed in the future.   Truitt Merle MD

## 2017-01-24 NOTE — Telephone Encounter (Signed)
Pt has a 1 year f/u appt on 3/14. She just saw Dr Leighton Ruff about 10 days ago and feels she does not need to keep this appt with Dr Burr Medico.

## 2017-01-26 ENCOUNTER — Ambulatory Visit: Payer: Medicare HMO | Admitting: Hematology

## 2017-01-26 ENCOUNTER — Other Ambulatory Visit: Payer: Medicare HMO

## 2017-02-17 DIAGNOSIS — H5213 Myopia, bilateral: Secondary | ICD-10-CM | POA: Diagnosis not present

## 2017-02-17 DIAGNOSIS — H40053 Ocular hypertension, bilateral: Secondary | ICD-10-CM | POA: Diagnosis not present

## 2017-02-17 DIAGNOSIS — H5203 Hypermetropia, bilateral: Secondary | ICD-10-CM | POA: Diagnosis not present

## 2017-02-17 DIAGNOSIS — H2513 Age-related nuclear cataract, bilateral: Secondary | ICD-10-CM | POA: Diagnosis not present

## 2017-02-17 DIAGNOSIS — H524 Presbyopia: Secondary | ICD-10-CM | POA: Diagnosis not present

## 2017-02-17 DIAGNOSIS — H52223 Regular astigmatism, bilateral: Secondary | ICD-10-CM | POA: Diagnosis not present

## 2017-02-28 DIAGNOSIS — H401121 Primary open-angle glaucoma, left eye, mild stage: Secondary | ICD-10-CM | POA: Diagnosis not present

## 2017-02-28 DIAGNOSIS — H5340 Unspecified visual field defects: Secondary | ICD-10-CM | POA: Diagnosis not present

## 2017-02-28 DIAGNOSIS — H40052 Ocular hypertension, left eye: Secondary | ICD-10-CM | POA: Diagnosis not present

## 2017-02-28 DIAGNOSIS — H40012 Open angle with borderline findings, low risk, left eye: Secondary | ICD-10-CM | POA: Diagnosis not present

## 2017-03-21 DIAGNOSIS — H401132 Primary open-angle glaucoma, bilateral, moderate stage: Secondary | ICD-10-CM | POA: Diagnosis not present

## 2017-04-04 DIAGNOSIS — H401131 Primary open-angle glaucoma, bilateral, mild stage: Secondary | ICD-10-CM | POA: Diagnosis not present

## 2017-05-05 ENCOUNTER — Encounter (HOSPITAL_COMMUNITY): Payer: Self-pay | Admitting: Emergency Medicine

## 2017-05-05 ENCOUNTER — Inpatient Hospital Stay (HOSPITAL_COMMUNITY)
Admission: EM | Admit: 2017-05-05 | Discharge: 2017-05-15 | DRG: 871 | Disposition: E | Payer: Medicare HMO | Attending: Internal Medicine | Admitting: Internal Medicine

## 2017-05-05 ENCOUNTER — Observation Stay (HOSPITAL_COMMUNITY): Payer: Medicare HMO

## 2017-05-05 ENCOUNTER — Emergency Department (HOSPITAL_COMMUNITY): Payer: Medicare HMO

## 2017-05-05 DIAGNOSIS — Z90710 Acquired absence of both cervix and uterus: Secondary | ICD-10-CM

## 2017-05-05 DIAGNOSIS — R4701 Aphasia: Secondary | ICD-10-CM | POA: Diagnosis present

## 2017-05-05 DIAGNOSIS — Z7189 Other specified counseling: Secondary | ICD-10-CM | POA: Diagnosis not present

## 2017-05-05 DIAGNOSIS — J189 Pneumonia, unspecified organism: Secondary | ICD-10-CM | POA: Diagnosis present

## 2017-05-05 DIAGNOSIS — E860 Dehydration: Secondary | ICD-10-CM | POA: Diagnosis present

## 2017-05-05 DIAGNOSIS — R4182 Altered mental status, unspecified: Secondary | ICD-10-CM | POA: Diagnosis present

## 2017-05-05 DIAGNOSIS — J309 Allergic rhinitis, unspecified: Secondary | ICD-10-CM | POA: Diagnosis present

## 2017-05-05 DIAGNOSIS — I360 Nonrheumatic tricuspid (valve) stenosis: Secondary | ICD-10-CM | POA: Diagnosis not present

## 2017-05-05 DIAGNOSIS — I110 Hypertensive heart disease with heart failure: Secondary | ICD-10-CM | POA: Diagnosis present

## 2017-05-05 DIAGNOSIS — L8990 Pressure ulcer of unspecified site, unspecified stage: Secondary | ICD-10-CM | POA: Insufficient documentation

## 2017-05-05 DIAGNOSIS — R7989 Other specified abnormal findings of blood chemistry: Secondary | ICD-10-CM | POA: Diagnosis not present

## 2017-05-05 DIAGNOSIS — R945 Abnormal results of liver function studies: Secondary | ICD-10-CM | POA: Diagnosis not present

## 2017-05-05 DIAGNOSIS — R402411 Glasgow coma scale score 13-15, in the field [EMT or ambulance]: Secondary | ICD-10-CM | POA: Diagnosis not present

## 2017-05-05 DIAGNOSIS — J9601 Acute respiratory failure with hypoxia: Secondary | ICD-10-CM | POA: Diagnosis present

## 2017-05-05 DIAGNOSIS — A419 Sepsis, unspecified organism: Principal | ICD-10-CM | POA: Diagnosis present

## 2017-05-05 DIAGNOSIS — G9341 Metabolic encephalopathy: Secondary | ICD-10-CM | POA: Diagnosis present

## 2017-05-05 DIAGNOSIS — Z66 Do not resuscitate: Secondary | ICD-10-CM | POA: Diagnosis present

## 2017-05-05 DIAGNOSIS — I10 Essential (primary) hypertension: Secondary | ICD-10-CM | POA: Diagnosis present

## 2017-05-05 DIAGNOSIS — I5181 Takotsubo syndrome: Secondary | ICD-10-CM | POA: Diagnosis present

## 2017-05-05 DIAGNOSIS — N179 Acute kidney failure, unspecified: Secondary | ICD-10-CM

## 2017-05-05 DIAGNOSIS — K7200 Acute and subacute hepatic failure without coma: Secondary | ICD-10-CM | POA: Diagnosis present

## 2017-05-05 DIAGNOSIS — K819 Cholecystitis, unspecified: Secondary | ICD-10-CM | POA: Diagnosis present

## 2017-05-05 DIAGNOSIS — I959 Hypotension, unspecified: Secondary | ICD-10-CM | POA: Diagnosis present

## 2017-05-05 DIAGNOSIS — Z8673 Personal history of transient ischemic attack (TIA), and cerebral infarction without residual deficits: Secondary | ICD-10-CM

## 2017-05-05 DIAGNOSIS — R339 Retention of urine, unspecified: Secondary | ICD-10-CM | POA: Diagnosis present

## 2017-05-05 DIAGNOSIS — R410 Disorientation, unspecified: Secondary | ICD-10-CM | POA: Diagnosis not present

## 2017-05-05 DIAGNOSIS — D709 Neutropenia, unspecified: Secondary | ICD-10-CM | POA: Diagnosis present

## 2017-05-05 DIAGNOSIS — I639 Cerebral infarction, unspecified: Secondary | ICD-10-CM | POA: Diagnosis present

## 2017-05-05 DIAGNOSIS — R55 Syncope and collapse: Secondary | ICD-10-CM

## 2017-05-05 DIAGNOSIS — K810 Acute cholecystitis: Secondary | ICD-10-CM | POA: Diagnosis present

## 2017-05-05 DIAGNOSIS — R4189 Other symptoms and signs involving cognitive functions and awareness: Secondary | ICD-10-CM

## 2017-05-05 DIAGNOSIS — Z888 Allergy status to other drugs, medicaments and biological substances status: Secondary | ICD-10-CM

## 2017-05-05 DIAGNOSIS — Z515 Encounter for palliative care: Secondary | ICD-10-CM | POA: Diagnosis not present

## 2017-05-05 DIAGNOSIS — D61818 Other pancytopenia: Secondary | ICD-10-CM | POA: Diagnosis present

## 2017-05-05 DIAGNOSIS — R6520 Severe sepsis without septic shock: Secondary | ICD-10-CM | POA: Diagnosis present

## 2017-05-05 DIAGNOSIS — G459 Transient cerebral ischemic attack, unspecified: Secondary | ICD-10-CM

## 2017-05-05 DIAGNOSIS — R509 Fever, unspecified: Secondary | ICD-10-CM

## 2017-05-05 DIAGNOSIS — Z79899 Other long term (current) drug therapy: Secondary | ICD-10-CM

## 2017-05-05 DIAGNOSIS — I5021 Acute systolic (congestive) heart failure: Secondary | ICD-10-CM | POA: Diagnosis present

## 2017-05-05 DIAGNOSIS — R0602 Shortness of breath: Secondary | ICD-10-CM | POA: Diagnosis not present

## 2017-05-05 DIAGNOSIS — E785 Hyperlipidemia, unspecified: Secondary | ICD-10-CM | POA: Diagnosis present

## 2017-05-05 DIAGNOSIS — R197 Diarrhea, unspecified: Secondary | ICD-10-CM | POA: Diagnosis present

## 2017-05-05 HISTORY — DX: Essential (primary) hypertension: I10

## 2017-05-05 LAB — COMPREHENSIVE METABOLIC PANEL
ALT: 44 U/L (ref 14–54)
AST: 228 U/L — ABNORMAL HIGH (ref 15–41)
Albumin: 2.9 g/dL — ABNORMAL LOW (ref 3.5–5.0)
Alkaline Phosphatase: 90 U/L (ref 38–126)
Anion gap: 8 (ref 5–15)
BUN: 36 mg/dL — ABNORMAL HIGH (ref 6–20)
CO2: 22 mmol/L (ref 22–32)
Calcium: 7.7 mg/dL — ABNORMAL LOW (ref 8.9–10.3)
Chloride: 106 mmol/L (ref 101–111)
Creatinine, Ser: 1.7 mg/dL — ABNORMAL HIGH (ref 0.44–1.00)
GFR calc Af Amer: 31 mL/min — ABNORMAL LOW (ref >60)
GFR calc non Af Amer: 26 mL/min — ABNORMAL LOW (ref >60)
Glucose, Bld: 124 mg/dL — ABNORMAL HIGH (ref 65–99)
Potassium: 3.9 mmol/L (ref 3.5–5.1)
Sodium: 136 mmol/L (ref 135–145)
Total Bilirubin: 0.9 mg/dL (ref 0.3–1.2)
Total Protein: 4.6 g/dL — ABNORMAL LOW (ref 6.5–8.1)

## 2017-05-05 LAB — URINALYSIS, ROUTINE W REFLEX MICROSCOPIC
Bilirubin Urine: NEGATIVE
Glucose, UA: NEGATIVE mg/dL
Ketones, ur: NEGATIVE mg/dL
Leukocytes, UA: NEGATIVE
Nitrite: NEGATIVE
Protein, ur: 100 mg/dL — AB
Specific Gravity, Urine: 1.015 (ref 1.005–1.030)
pH: 5 (ref 5.0–8.0)

## 2017-05-05 LAB — RETICULOCYTES
RBC.: 3.14 MIL/uL — ABNORMAL LOW (ref 3.87–5.11)
Retic Ct Pct: <0.4 % — ABNORMAL LOW (ref 0.4–3.1)

## 2017-05-05 LAB — TROPONIN I: Troponin I: <0.03 ng/mL (ref <0.03)

## 2017-05-05 LAB — CBC WITH DIFFERENTIAL/PLATELET
Basophils Absolute: 0 10*3/uL (ref 0.0–0.1)
Basophils Relative: 1 %
Eosinophils Absolute: 0 10*3/uL (ref 0.0–0.7)
Eosinophils Relative: 0 %
HCT: 33.2 % — ABNORMAL LOW (ref 36.0–46.0)
Hemoglobin: 11.1 g/dL — ABNORMAL LOW (ref 12.0–15.0)
Lymphocytes Relative: 5 %
Lymphs Abs: 0.1 10*3/uL — ABNORMAL LOW (ref 0.7–4.0)
MCH: 35.8 pg — ABNORMAL HIGH (ref 26.0–34.0)
MCHC: 33.4 g/dL (ref 30.0–36.0)
MCV: 107.1 fL — ABNORMAL HIGH (ref 78.0–100.0)
Monocytes Absolute: 0.1 10*3/uL (ref 0.1–1.0)
Monocytes Relative: 6 %
Neutro Abs: 1.3 10*3/uL — ABNORMAL LOW (ref 1.7–7.7)
Neutrophils Relative %: 88 %
Platelets: 41 10*3/uL — ABNORMAL LOW (ref 150–400)
RBC: 3.1 MIL/uL — ABNORMAL LOW (ref 3.87–5.11)
RDW: 12.8 % (ref 11.5–15.5)
WBC: 1.5 10*3/uL — ABNORMAL LOW (ref 4.0–10.5)

## 2017-05-05 LAB — CBG MONITORING, ED: Glucose-Capillary: 92 mg/dL (ref 65–99)

## 2017-05-05 LAB — I-STAT ARTERIAL BLOOD GAS, ED
Acid-base deficit: 7 mmol/L — ABNORMAL HIGH (ref 0.0–2.0)
Bicarbonate: 15.6 mmol/L — ABNORMAL LOW (ref 20.0–28.0)
O2 Saturation: 91 %
Patient temperature: 99
TCO2: 16 mmol/L (ref 0–100)
pCO2 arterial: 23.6 mmHg — ABNORMAL LOW (ref 32.0–48.0)
pH, Arterial: 7.429 (ref 7.350–7.450)
pO2, Arterial: 59 mmHg — ABNORMAL LOW (ref 83.0–108.0)

## 2017-05-05 LAB — AMMONIA: Ammonia: 17 umol/L (ref 9–35)

## 2017-05-05 MED ORDER — SODIUM CHLORIDE 0.9% FLUSH
3.0000 mL | Freq: Two times a day (BID) | INTRAVENOUS | Status: DC
  Administered 2017-05-07: 3 mL via INTRAVENOUS
  Administered 2017-05-09: 10 mL via INTRAVENOUS
  Administered 2017-05-09 – 2017-05-10 (×2): 3 mL via INTRAVENOUS

## 2017-05-05 MED ORDER — SODIUM CHLORIDE 0.9 % IV SOLN
250.0000 mL | INTRAVENOUS | Status: DC | PRN
Start: 2017-05-05 — End: 2017-05-12
  Administered 2017-05-07: 250 mL via INTRAVENOUS

## 2017-05-05 MED ORDER — SODIUM CHLORIDE 0.9% FLUSH
3.0000 mL | INTRAVENOUS | Status: DC | PRN
  Administered 2017-05-09: 3 mL via INTRAVENOUS
  Filled 2017-05-05: qty 3

## 2017-05-05 MED ORDER — SODIUM CHLORIDE 0.9% FLUSH
3.0000 mL | Freq: Two times a day (BID) | INTRAVENOUS | Status: DC
  Administered 2017-05-07 – 2017-05-11 (×6): 3 mL via INTRAVENOUS

## 2017-05-05 MED ORDER — SODIUM CHLORIDE 0.9 % IV BOLUS (SEPSIS)
1000.0000 mL | Freq: Once | INTRAVENOUS | Status: AC
  Administered 2017-05-05: 1000 mL via INTRAVENOUS

## 2017-05-05 MED ORDER — ASPIRIN EC 81 MG PO TBEC: 81.0000 mg | DELAYED_RELEASE_TABLET | Freq: Every day | ORAL | Status: DC

## 2017-05-05 NOTE — ED Provider Notes (Signed)
[facility] DEPT Provider Note   CSN: [id number] Arrival date & time: 05/08/2017  1730     History   Chief Complaint Chief Complaint  Patient presents with  . Altered Mental Status    HPI Kelly Wolfe is a 83 y.o. female with history of neutropenia, hypertension who presents with altered mental status. Patient normally lives alone and takes care of herself without any problem. EMS reports that the [facility] came by the house today and found the patient staring off on the porch in her chair. The [facility] called the ambulance. Patient is having trouble finishing her sentences but states she feels okay. She reports she has been urinating more frequently, but could not say for how long. Her son reports that she just returned from the beach yesterday and was feeling very worn out, but had otherwise been feeling okay. Her son was called today when and once was called. Patient's daughter reports that patient noted some nonbloody diarrhea as ago, but none since. He states she has had no problems with any altered mental status in the past and has lived alone just fine previously. Patient's family notes she is much more quiet than usual. Patient denies any chest pain, shortness of breath, abdominal pain, nausea, vomiting. Patient could not say whether she has fallen or not, but denies any pain.  Patient reportedly drinks alcohol every day, specifically scotch. She has not had a drink in 2-3 days.  HPI  Past Medical History:  Diagnosis Date  . Allergic rhinitis   . Hypertension   . Neutropenia    with elevated MCV no dx saw heme in the past neg rheum zieminski  . Osteoarthritis   . Osteopenia    mild dexa 2007 -0.7 hip sp nl  . Spinal stenosis     Patient Active Problem List   Diagnosis Date Noted  . Altered mental status 05/04/2017  . Expressive aphasia 04/18/2017  . Syncope 04/19/2017  . AKI (acute kidney injury) ([place]) 04/20/2017  . Dehydration 04/24/2017  .  Macrocytosis without anemia 01/30/2016  . Hyperglycemia 07/03/2014  . Excess wax in ear 07/03/2014  . Visit for preventive health examination 07/03/2014  . Malaise and fatigue 11/10/2012  . HTN (hypertension) 10/31/2012  . Elevated blood pressure reading 09/05/2012  . High risk medication use 07/04/2012  . Preventative health care 12/08/2011  . Sinus disorder 12/08/2011  . Depressive reaction 07/01/2011  . Hearing loss 03/31/2011  . Tinnitus of right ear 03/31/2011  . Dizziness 03/31/2011  . Asymmetrical hearing loss 03/31/2011  . Neutropenia ([place])   . Osteopenia   . MALAISE AND FATIGUE 12/03/2010  . URINALYSIS, ABNORMAL 12/22/2009  . [place], MILD 04/22/2008  . SLEEPLESSNESS 04/22/2008  . TINNITUS 10/04/2007  . NEUTROPENIA NOS 07/25/2007  . ALLERGIC RHINITIS 07/25/2007  . OSTEOARTHRITIS 07/25/2007  . SPINAL STENOSIS 07/25/2007  . OSTEOPENIA 07/25/2007    Past Surgical History:  Procedure Laterality Date  . ABDOMINAL HYSTERECTOMY     total  . ROTATOR CUFF REPAIR  01/08   right    OB History    No data available       Home Medications    Prior to Admission medications   Medication Sig Start Date End Date Taking? Authorizing Provider  amLODipine (NORVASC) 2.5 MG tablet TAKE 1 TABLET BY MOUTH DAILY Patient taking differently: Take 2.5 mg by mouth once a day 08/28/15  Yes Panosh, Standley Brooking, MD  cetirizine (ZYRTEC) 10 MG tablet Take 10 mg by mouth daily  as needed for allergies.    Yes [provider]  dorzolamide-timolol (COSOPT) 22.3-6.8 MG/ML ophthalmic solution Place 1 drop into both eyes 2 (two) times daily.   Yes [provider]  fluticasone (FLONASE) 50 MCG/ACT nasal spray USE 2 SPRAYS IN THE NOSE DAILY Patient taking differently: Instill 2 sprays into each nostril once a day as needed for seasonal allergies 04/16/14  Yes Panosh, Standley Brooking, MD  latanoprost (XALATAN) 0.005 % ophthalmic solution Place 1 drop into both eyes at bedtime.   Yes  [provider]  Multiple Vitamins-Minerals (ONE-A-DAY WOMENS 50+ ADVANTAGE) TABS Take 1 tablet by mouth daily.   Yes [provider]  calcium-vitamin D (OSCAL WITH D) 500-200 MG-UNIT per tablet Take 1 tablet by mouth daily.      [provider]  traZODone (DESYREL) 50 MG tablet TAKE 1/2 - 1 TABLETS BY MOUTH AT [facility] NEEDED FOR SLEEP 01/15/15   Panosh, Standley Brooking, MD  Vitamins-Lipotropics (LIPOFLAVONOID PO) Take 1 tablet by mouth daily.    [provider]    Family History No family history on file.  Social History Social History  Substance Use Topics  . Smoking status: Never Smoker  . Smokeless tobacco: Never Used  . Alcohol use 4.2 oz/week    7 Glasses of wine per week     Comment: cocktail in afternoon, daily      Allergies   Lisinopril and Prednisone   Review of Systems Review of Systems  Constitutional: Negative for chills and fever.  HENT: Negative for facial swelling and sore throat.   Respiratory: Negative for shortness of breath.   Cardiovascular: Negative for chest pain.  Gastrointestinal: Negative for abdominal pain, nausea and vomiting.  Genitourinary: Positive for frequency. Negative for dysuria.  Musculoskeletal: Negative for back pain.  Skin: Negative for rash and wound.  Neurological: Negative for headaches.  Psychiatric/Behavioral: Positive for confusion. The patient is not nervous/anxious.      Physical Exam Updated Vital Signs BP 129/63 (BP Location: Right Arm)   Pulse 82   Temp 100.1 F (37.8 C)   Resp 18   Ht 5' (1.524 m)   Wt 50.8 kg (111 lb 14.4 oz)   SpO2 97%   BMI 21.85 kg/m   Physical Exam  Constitutional: She appears well-developed and well-nourished. No distress.  HENT:  Head: Normocephalic and atraumatic.  Mouth/Throat: Oropharynx is clear and moist. No oropharyngeal exudate.  Eyes: Conjunctivae and EOM are normal. Pupils are equal, round, and reactive to light. Right eye exhibits no discharge.  Left eye exhibits no discharge. No scleral icterus.  Neck: Normal range of motion. Neck supple. No thyromegaly present.  Cardiovascular: Normal rate, regular rhythm, normal heart sounds and intact distal pulses.  Exam reveals no gallop and no friction rub.   No murmur heard. Pulmonary/Chest: Effort normal and breath sounds normal. No stridor. No respiratory distress. She has no wheezes. She has no rales.  Abdominal: Soft. Bowel sounds are normal. She exhibits no distension. There is no tenderness. There is no rebound and no guarding.  Musculoskeletal: She exhibits no edema.  No midline tenderness to the cervical, thoracic, or lumbar spine; no tenderness to bilateral hips  Lymphadenopathy:    She has no cervical adenopathy.  Neurological: She is alert. Coordination normal.  CN 3-12 intact; normal sensation throughout; 5/5 strength in all 4 extremities; equal bilateral grip strength; no ataxia on finger to nose; normal heel-to-shin test; patient is oriented to self, place, but cannot recall time  Skin:  Skin is warm and dry. No rash noted. She is not diaphoretic. No pallor.  Psychiatric: She has a normal mood and affect.  Nursing note and vitals reviewed.    ED Treatments / Results  Labs (all labs ordered are listed, but only abnormal results are displayed) Labs Reviewed  CBC WITH DIFFERENTIAL/PLATELET - Abnormal; Notable for the following:       Result Value   WBC 1.5 (*)    RBC 3.10 (*)    Hemoglobin 11.1 (*)    HCT 33.2 (*)    MCV 107.1 (*)    MCH 35.8 (*)    Platelets 41 (*)    Neutro Abs 1.3 (*)    Lymphs Abs 0.1 (*)    All other components within normal limits  URINALYSIS, ROUTINE W REFLEX MICROSCOPIC - Abnormal; Notable for the following:    Color, Urine AMBER (*)    APPearance CLOUDY (*)    Hgb urine dipstick SMALL (*)    Protein, ur 100 (*)    Bacteria, UA MANY (*)    Squamous Epithelial / LPF 0-5 (*)    All other components within normal limits  COMPREHENSIVE  METABOLIC PANEL - Abnormal; Notable for the following:    Glucose, Bld 124 (*)    BUN 36 (*)    Creatinine, Ser 1.70 (*)    Calcium 7.7 (*)    Total Protein 4.6 (*)    Albumin 2.9 (*)    AST 228 (*)    GFR calc non Af Amer 26 (*)    GFR calc Af Amer 31 (*)    All other components within normal limits  RETICULOCYTES - Abnormal; Notable for the following:    Retic Ct Pct <0.4 (*)    RBC. 3.14 (*)    All other components within normal limits  I-STAT ARTERIAL BLOOD GAS, ED - Abnormal; Notable for the following:    pCO2 arterial 23.6 (*)    pO2, Arterial 59.0 (*)    Bicarbonate 15.6 (*)    Acid-base deficit 7.0 (*)    All other components within normal limits  AMMONIA  TROPONIN I  LACTIC ACID, PLASMA  TROPONIN I  TROPONIN I  TROPONIN I  BASIC METABOLIC PANEL  CBC  RAPID URINE DRUG SCREEN, HOSP PERFORMED  ETHANOL  LACTIC ACID, PLASMA  VITAMIN B12  FOLATE  IRON AND TIBC  FERRITIN  CBG MONITORING, ED    EKG  EKG Interpretation  Date/Time:  Thursday May 05 2017 18:16:01 EDT Ventricular Rate:  80 PR Interval:    QRS Duration: 91 QT Interval:  384 QTC Calculation: 443 R Axis:   -32 Text Interpretation:  Sinus rhythm Left axis deviation Low voltage, precordial leads no stemi   No old tracing to compare Confirmed by Addison Lank [id number]) on 04/23/2017 9:57:00 PM       Radiology Dg Chest 2 View  Result Date: 04/15/2017 CLINICAL DATA:  Altered mental status. EXAM: CHEST  2 VIEW COMPARISON:  None. FINDINGS: Cardiomediastinal silhouette is normal, calcified aortic knob. Low lung volumes within diffusely prominent interstitium, no pleural effusion or focal consolidation. No pneumothorax. Osteopenia. Soft tissue planes and included osseous structures are nonsuspicious. IMPRESSION: Interstitial prominence concern for atypical infection without focal consolidation. Electronically Signed   By: Elon Alas M.D.   On: 04/29/2017 22:26   Ct Head Wo Contrast  Result Date:  05/09/2017 CLINICAL DATA:  Pt unable to provide hx. Per ED notes: Per [facility], Pt from home where she lives alone.  Pt normally talking and walking independently. Armed forces operational officer came over and found pt on back porch staring into space. EXAM: CT HEAD WITHOUT CONTRAST TECHNIQUE: Contiguous axial images were obtained from the base of the skull through the vertex without intravenous contrast. COMPARISON:  None. FINDINGS: Brain: There is mild central and cortical atrophy. Remote lacunar infarct is identified within the left basal ganglia. There is no intra or extra-axial fluid collection or mass lesion. The basilar cisterns and ventricles have a normal appearance. There is no CT evidence for acute infarction or hemorrhage. Vascular: There is atherosclerotic calcification of the carotid siphons. Skull: Normal. Negative for fracture or focal lesion. Sinuses/Orbits: No acute finding. Other: Small amount of gas is identified within the cavernous sinuses, consistent with recently placed intravenous line. IMPRESSION: No evidence for acute intracranial abnormality. Remote lacunar infarct of the left basal ganglia. Mild atrophy. Electronically Signed   By: Nolon Nations M.D.   On: 04/21/2017 18:39    Procedures Procedures (including critical care time)  Medications Ordered in ED Medications  sodium chloride flush (NS) 0.9 % injection 3 mL (0 mLs Intravenous Duplicate 12/04/39 7408)  sodium chloride flush (NS) 0.9 % injection 3 mL (3 mLs Intravenous Not Given 04/17/2017 2311)  sodium chloride flush (NS) 0.9 % injection 3 mL (not administered)  0.9 %  sodium chloride infusion (not administered)  aspirin EC tablet 81 mg (not administered)  LORazepam (ATIVAN) tablet 1 mg (not administered)    Or  LORazepam (ATIVAN) injection 1 mg (not administered)  thiamine (VITAMIN B-1) tablet 100 mg (not administered)    Or  thiamine (B-1) injection 100 mg (not administered)  folic acid (FOLVITE) tablet 1 mg (not administered)    multivitamin with minerals tablet 1 tablet (not administered)  LORazepam (ATIVAN) injection 0-4 mg (not administered)    Followed by  LORazepam (ATIVAN) injection 0-4 mg (not administered)  sodium chloride 0.9 % bolus 1,000 mL (0 mLs Intravenous Stopped 04/27/2017 2005)  sodium chloride 0.9 % bolus 1,000 mL (1,000 mLs Intravenous New Bag/Given 04/21/2017 2106)     Initial Impression / Assessment and Plan / ED Course  I have reviewed the triage vital signs and the nursing notes.  Pertinent labs & imaging results that were available during my care of the patient were reviewed by me and considered in my medical decision making (see chart for details).     Patient presenting with altered mental status. CBC shows neutropenia, mild anemia with hemoglobin 11.1, and thrombocytopenia with platelets 41K. CMP shows BUN 36, creatinine 1.7, calcium 7.7, protein 4.6, albumin 2.9, AST 228. Ammonia 17. Troponin WNL. UA shows small hematuria, many bacteria. CXR shows no acute findings; remote lacunar infarct of the left basal ganglia. EKG shows NSR, left axis deviation. After evaluation by Dr. Leonette Monarch, patient may be experiencing alcohol withdrawal as possible contributing factor. I discussed patient case with Dr. Shanon Brow with [facility] who will admit the patient for further evaluation and treatment.  Final Clinical Impressions(s) / ED Diagnoses   Final diagnoses:  Altered mental status, unspecified altered mental status type    New Prescriptions Current Discharge Medication List       Caryl Ada 05/06/17 0027    Fatima Blank, MD 05/18/17 [phone]

## 2017-05-05 NOTE — ED Notes (Signed)
Patient transported to X-ray 

## 2017-05-05 NOTE — ED Notes (Signed)
Patient transported to CT 

## 2017-05-05 NOTE — ED Triage Notes (Signed)
Per [facility],  Pt from home where she lives alone. Pt normally talking and walking independently. Armed forces operational officer came over and found pt on back porch staring into space. EMS reports a beer was found sitting beside pt. Son at bedside reports talking to pt yesterday afternoon around 3pm. Pt's initial BP 70/40. EMS gave 1000 ml of NS en route, pt's BP increased to 100/48. Pt alert, able to communicate name, DOB, where she's at, disoriented to time and situation. Pt denies pain, headache. Pt did have an incontinent episode of bowel and bladder.

## 2017-05-05 NOTE — ED Notes (Signed)
Pt informed of need for urine sample. Pt unable to provide one at this time.

## 2017-05-05 NOTE — H&P (Signed)
History and Physical    Kelly Wolfe:[id number] DOB: 1931-08-04 DOA: 04/16/2017  PCP: Leighton Ruff, MD  Patient coming from: home Chief Complaint:  confusion  HPI: Kelly Wolfe is a 83 y.o. female with medical history significant of HTN, chronic neutropenia presents to the ED with confusion.  Per PA in ED initally pt had her [employer] rep come to see her today who found her confused which was unusual and was therefore brought to the ED via EMS.    After questioning the daughter who has been present in the ED for several hours, pt actually was found down unresponsive on her porch by the [employer] rep.  911 was called.  At the bedside of the patient in the ED there is documention from EMS with sbp noted to be around 70 in the field when they first found patient.  Several bp were taken when patient was found unresponsive on the porch and both were below 80, HR was normal, oxygen sats were normal.  Pt is very slow to respond to questions which daughter states is unusual.  They just got back from the beach, and she did have a period of less than 24 hours when she had loose stools, but only maybe 2 or 3 loose BMs.  No fevers.  No n/v.  Pt denies any abdominal pain.  No urinary symptoms.  No focal neurological symptoms except the slow response verbally.  Pt denies any vision changes.  Her blood pressures have all been normal in the ED.  Pt has been given 2 liters of NS ivf.  Pt has no rashes.  Daughter also reports she drinks scotch daily, unknown amount, unknown last drink.  Pt has no h/o seizures, no h/o CVA in the past or arrythmias or CAD.  Pt reports she has not been eating or drinking normally for several days.  She lives alone.   Review of Systems: As per HPI otherwise 10 point review of systems negative.   Past Medical History:  Diagnosis Date  . Allergic rhinitis   . Hypertension   . Neutropenia    with elevated MCV no dx saw heme in the past neg rheum zieminski  . Osteoarthritis   .  Osteopenia    mild dexa 2007 -0.7 hip sp nl  . Spinal stenosis     Past Surgical History:  Procedure Laterality Date  . ABDOMINAL HYSTERECTOMY     total  . ROTATOR CUFF REPAIR  01/08   right     reports that she has never smoked. She has never used smokeless tobacco. She reports that she drinks about 4.2 oz of alcohol per week . She reports that she does not use drugs.  Allergies  Allergen Reactions  . Lisinopril Cough    Went away with DC (?)  . Prednisone Other (See Comments)    Nausea, shaky, unsteady, decreased appetite    No family history on file.  No premature CAD  Prior to Admission medications   Medication Sig Start Date End Date Taking? Authorizing Provider  amLODipine (NORVASC) 2.5 MG tablet TAKE 1 TABLET BY MOUTH DAILY Patient taking differently: Take 2.5 mg by mouth once a day 08/28/15  Yes Panosh, Standley Brooking, MD  cetirizine (ZYRTEC) 10 MG tablet Take 10 mg by mouth daily as needed for allergies.    Yes [provider]  dorzolamide-timolol (COSOPT) 22.3-6.8 MG/ML ophthalmic solution Place 1 drop into both eyes 2 (two) times daily.   Yes [provider]  fluticasone (FLONASE) 50 MCG/ACT nasal spray USE 2 SPRAYS IN THE NOSE DAILY Patient taking differently: Instill 2 sprays into each nostril once a day as needed for seasonal allergies 04/16/14  Yes Panosh, Standley Brooking, MD  latanoprost (XALATAN) 0.005 % ophthalmic solution Place 1 drop into both eyes at bedtime.   Yes [provider]  Multiple Vitamins-Minerals (ONE-A-DAY WOMENS 50+ ADVANTAGE) TABS Take 1 tablet by mouth daily.   Yes [provider]  calcium-vitamin D (OSCAL WITH D) 500-200 MG-UNIT per tablet Take 1 tablet by mouth daily.      [provider]  traZODone (DESYREL) 50 MG tablet TAKE 1/2 - 1 TABLETS BY MOUTH AT [facility] NEEDED FOR SLEEP 01/15/15   Panosh, Standley Brooking, MD  Vitamins-Lipotropics (LIPOFLAVONOID PO) Take 1 tablet by mouth daily.    [provider]     Physical Exam: Vitals:   04/26/2017 1731 04/30/2017 1740 05/10/2017 1900 04/19/2017 2000  BP:  (!) 105/58 111/63 123/68  Pulse:  79 74 78  Resp:  19 18 (!) 22  Temp:  99 F (37.2 C)    TempSrc:  Oral    SpO2: 99% 96% 94% 95%    Constitutional: NAD, calm, comfortable, slow to respond verbally having a hard time expressing herself Vitals:   04/23/2017 1731 04/25/2017 1740 05/06/2017 1900 04/20/2017 2000  BP:  (!) 105/58 111/63 123/68  Pulse:  79 74 78  Resp:  19 18 (!) 22  Temp:  99 F (37.2 C)    TempSrc:  Oral    SpO2: 99% 96% 94% 95%   Eyes: PERRL, lids and conjunctivae normal ENMT: Mucous membranes are moist. Posterior pharynx clear of any exudate or lesions.Normal dentition.  Neck: normal, supple, no masses, no thyromegaly Respiratory: clear to auscultation bilaterally, no wheezing, no crackles. Normal respiratory effort. No accessory muscle use.  Cardiovascular: Regular rate and rhythm, no murmurs / rubs / gallops. No extremity edema. 2+ pedal pulses. No carotid bruits.  Abdomen: no tenderness, no masses palpated. No hepatosplenomegaly. Bowel sounds positive.  Musculoskeletal: no clubbing / cyanosis. No joint deformity upper and lower extremities. Good ROM, no contractures. Normal muscle tone.  Skin: no rashes, lesions, ulcers. No induration Neurologic: CN 2-12 grossly intact. Sensation intact, DTR normal. Strength 5/5 in all 4.  Psychiatric: Normal judgment and insight. Alert and oriented x 3. Normal mood.    Labs on Admission: I have personally reviewed following labs and imaging studies  CBC:  Recent Labs Lab 04/17/2017 1753  WBC 1.5*  NEUTROABS 1.3*  HGB 11.1*  HCT 33.2*  MCV 107.1*  PLT 41*   Basic Metabolic Panel:  Recent Labs Lab 05/08/2017 1753  NA 136  K 3.9  CL 106  CO2 22  GLUCOSE 124*  BUN 36*  CREATININE 1.70*  CALCIUM 7.7*   GFR: CrCl cannot be calculated (Unknown ideal weight.). Liver Function Tests:  Recent Labs Lab 04/15/2017 1753  AST 228*   ALT 44  ALKPHOS 90  BILITOT 0.9  PROT 4.6*  ALBUMIN 2.9*   CBG:  Recent Labs Lab 04/25/2017 1807  GLUCAP 92   Urine analysis:    Component Value Date/Time   COLORURINE AMBER (A) 04/21/2017 2004   APPEARANCEUR CLOUDY (A) 04/24/2017 2004   LABSPEC 1.015 05/06/2017 2004   PHURINE 5.0 05/01/2017 2004   GLUCOSEU NEGATIVE 04/25/2017 2004   GLUCOSEU NEGATIVE 01/07/2010 1406   HGBUR SMALL (A) 05/13/2017 2004   HGBUR negative 12/03/2010 [place] NEGATIVE 05/09/2017 2004   BILIRUBINUR  Neg 09/05/2013 [place] 05/06/2017 2004   PROTEINUR 100 (A) 04/23/2017 2004   UROBILINOGEN 0.2 09/05/2013 1237   UROBILINOGEN 0.2 12/03/2010 0844   NITRITE NEGATIVE 04/20/2017 2004   LEUKOCYTESUR NEGATIVE 04/18/2017 2004   Radiological Exams on Admission: Ct Head Wo Contrast  Result Date: 04/28/2017 CLINICAL DATA:  Pt unable to provide hx. Per ED notes: Per [facility], Pt from home where she lives alone. Pt normally talking and walking independently. Armed forces operational officer came over and found pt on back porch staring into space. EXAM: CT HEAD WITHOUT CONTRAST TECHNIQUE: Contiguous axial images were obtained from the base of the skull through the vertex without intravenous contrast. COMPARISON:  None. FINDINGS: Brain: There is mild central and cortical atrophy. Remote lacunar infarct is identified within the left basal ganglia. There is no intra or extra-axial fluid collection or mass lesion. The basilar cisterns and ventricles have a normal appearance. There is no CT evidence for acute infarction or hemorrhage. Vascular: There is atherosclerotic calcification of the carotid siphons. Skull: Normal. Negative for fracture or focal lesion. Sinuses/Orbits: No acute finding. Other: Small amount of gas is identified within the cavernous sinuses, consistent with recently placed intravenous line. IMPRESSION: No evidence for acute intracranial abnormality. Remote lacunar infarct of the left basal ganglia.  Mild atrophy. Electronically Signed   By: Nolon Nations M.D.   On: 05/01/2017 18:39    EKG: Independently reviewed. nsr no acute issues Old record reviewed Case discussed with EDP  Assessment/Plan 83 yo female with syncopal episode in the setting of AKI, overall malnourishment and expressive aphasia  Principal Problem:   Altered mental status/encephalopathy- unclear etiology.  Pt vitals have normalized in the ED and her expression has not returned to baseline, unsure how accurate the bp readings were in the field done by the fire department.  Her etoh history is concerning also.  Will obtain mri brain if this shows any abnormality would proceed with full stroke work up, will obtain EEG in the am.  Obtain ABG now along with ammonia level.  Check lactic acid level although both ua and cxr appear normal, pt is afebrile will hold off on abx at this time.  Her macrocytosis and low alb levels are c/w chronic etoh abuse.  She does not appear to be overtly withdrawing from ETOH however.  Will ck etoh level and uds.  Place on CIWA and give bananna bag.  Glucose is documented with first responder paper also as to be normal, it is normal here as well.  Obtain freq neuro checks.  She is mildly dehydration with cr of 1.7, unknown baseline, but this does not explain her expressive aphasia.  Active Problems:   Expressive aphasia- as above,  Top ddx is stroke vs wernikes as explained above.  Work up as above   Syncope- as above   Neutropenia ([place])- chronic, pt afebrile   HTN (hypertension)- holding all bp meds due to hypotension indicated on first responders documentation   AKI (acute kidney injury) ([place])- cr 1.7, last cr over 2 years ago here was 0.8   Dehydration- ivf    DVT prophylaxis: scds Code Status:  full Family Communication: daughter  Disposition Plan:  Per day team Consults called:  none Admission status:  observation   Beren Yniguez A MD [facility]  If 7PM-7AM, please contact  night-coverage [email or web address] Password [facility]  05/03/2017, 9:58 PM

## 2017-05-06 ENCOUNTER — Observation Stay (HOSPITAL_COMMUNITY): Payer: Medicare HMO

## 2017-05-06 ENCOUNTER — Observation Stay (HOSPITAL_BASED_OUTPATIENT_CLINIC_OR_DEPARTMENT_OTHER): Payer: Medicare HMO

## 2017-05-06 DIAGNOSIS — G459 Transient cerebral ischemic attack, unspecified: Secondary | ICD-10-CM | POA: Diagnosis not present

## 2017-05-06 DIAGNOSIS — R410 Disorientation, unspecified: Secondary | ICD-10-CM

## 2017-05-06 DIAGNOSIS — R4182 Altered mental status, unspecified: Secondary | ICD-10-CM | POA: Diagnosis not present

## 2017-05-06 DIAGNOSIS — R5081 Fever presenting with conditions classified elsewhere: Secondary | ICD-10-CM

## 2017-05-06 LAB — VAS US CAROTID
LCCADDIAS: 16 cm/s
LCCADSYS: 81 cm/s
LCCAPSYS: 64 cm/s
LEFT ECA DIAS: -9 cm/s
LEFT VERTEBRAL DIAS: 14 cm/s
LICADDIAS: -22 cm/s
LICADSYS: -79 cm/s
LICAPSYS: -58 cm/s
Left CCA prox dias: 5 cm/s
Left ICA prox dias: -11 cm/s
RCCADSYS: -73 cm/s
RIGHT ECA DIAS: 4 cm/s
RIGHT VERTEBRAL DIAS: 7 cm/s
Right CCA prox dias: 14 cm/s
Right CCA prox sys: 72 cm/s

## 2017-05-06 LAB — DIFFERENTIAL
Basophils Absolute: 0 10*3/uL (ref 0.0–0.1)
Basophils Relative: 0 %
Eosinophils Absolute: 0 10*3/uL (ref 0.0–0.7)
Eosinophils Relative: 0 %
LYMPHS ABS: 0.1 10*3/uL — AB (ref 0.7–4.0)
LYMPHS PCT: 9 %
Monocytes Absolute: 0 10*3/uL — ABNORMAL LOW (ref 0.1–1.0)
Monocytes Relative: 2 %
NEUTROS ABS: 1 10*3/uL — AB (ref 1.7–7.7)
NEUTROS PCT: 89 %

## 2017-05-06 LAB — BASIC METABOLIC PANEL
Anion gap: 9 (ref 5–15)
BUN: 33 mg/dL — ABNORMAL HIGH (ref 6–20)
CALCIUM: 7.8 mg/dL — AB (ref 8.9–10.3)
CHLORIDE: 110 mmol/L (ref 101–111)
CO2: 20 mmol/L — ABNORMAL LOW (ref 22–32)
CREATININE: 1.35 mg/dL — AB (ref 0.44–1.00)
GFR calc non Af Amer: 35 mL/min — ABNORMAL LOW (ref >60)
GFR, EST AFRICAN AMERICAN: 41 mL/min — AB (ref >60)
Glucose, Bld: 106 mg/dL — ABNORMAL HIGH (ref 65–99)
Potassium: 3.7 mmol/L (ref 3.5–5.1)
SODIUM: 139 mmol/L (ref 135–145)

## 2017-05-06 LAB — RAPID URINE DRUG SCREEN, HOSP PERFORMED
Amphetamines: NOT DETECTED
BENZODIAZEPINES: NOT DETECTED
Barbiturates: NOT DETECTED
Cocaine: NOT DETECTED
OPIATES: NOT DETECTED
Tetrahydrocannabinol: NOT DETECTED

## 2017-05-06 LAB — IRON AND TIBC
Iron: 74 ug/dL (ref 28–170)
Saturation Ratios: 45 % — ABNORMAL HIGH (ref 10.4–31.8)
TIBC: 164 ug/dL — ABNORMAL LOW (ref 250–450)
UIBC: 90 ug/dL

## 2017-05-06 LAB — TROPONIN I
TROPONIN I: 0.03 ng/mL — AB (ref <0.03)
TROPONIN I: 0.06 ng/mL — AB (ref <0.03)
TROPONIN I: 0.06 ng/mL — AB (ref <0.03)

## 2017-05-06 LAB — CBC
HCT: 34.4 % — ABNORMAL LOW (ref 36.0–46.0)
Hemoglobin: 11.7 g/dL — ABNORMAL LOW (ref 12.0–15.0)
MCH: 35.9 pg — ABNORMAL HIGH (ref 26.0–34.0)
MCHC: 34 g/dL (ref 30.0–36.0)
MCV: 105.5 fL — AB (ref 78.0–100.0)
PLATELETS: 38 10*3/uL — AB (ref 150–400)
RBC: 3.26 MIL/uL — AB (ref 3.87–5.11)
RDW: 12.7 % (ref 11.5–15.5)
WBC: 0.9 10*3/uL — AB (ref 4.0–10.5)

## 2017-05-06 LAB — LACTIC ACID, PLASMA
LACTIC ACID, VENOUS: 1.7 mmol/L (ref 0.5–1.9)
LACTIC ACID, VENOUS: 1.4 mmol/L (ref 0.5–1.9)

## 2017-05-06 LAB — VITAMIN B12: VITAMIN B 12: 2470 pg/mL — AB (ref 180–914)

## 2017-05-06 LAB — FERRITIN: Ferritin: >7500 ng/mL — ABNORMAL HIGH (ref 11–307)

## 2017-05-06 LAB — FOLATE: FOLATE: 50.1 ng/mL (ref >5.9)

## 2017-05-06 LAB — ETHANOL

## 2017-05-06 MED ORDER — ADULT MULTIVITAMIN W/MINERALS CH
1.0000 | ORAL_TABLET | Freq: Every day | ORAL | Status: DC
  Administered 2017-05-06: 1 via ORAL
  Filled 2017-05-06 (×4): qty 1

## 2017-05-06 MED ORDER — LORAZEPAM 1 MG PO TABS: 1.0000 mg | ORAL_TABLET | Freq: Four times a day (QID) | ORAL | Status: DC | PRN

## 2017-05-06 MED ORDER — LORAZEPAM 2 MG/ML IJ SOLN: 0.0000 mg | Freq: Two times a day (BID) | INTRAMUSCULAR | Status: DC

## 2017-05-06 MED ORDER — LEVOFLOXACIN IN D5W 750 MG/150ML IV SOLN
750.0000 mg | Freq: Once | INTRAVENOUS | Status: AC
  Administered 2017-05-06: 750 mg via INTRAVENOUS
  Filled 2017-05-06: qty 150

## 2017-05-06 MED ORDER — STROKE: EARLY STAGES OF RECOVERY BOOK
Freq: Once | Status: AC
  Administered 2017-05-06: 08:00:00
  Filled 2017-05-06 (×2): qty 1

## 2017-05-06 MED ORDER — SODIUM CHLORIDE 0.9 % IV SOLN
INTRAVENOUS | Status: DC
  Administered 2017-05-06 (×2): via INTRAVENOUS

## 2017-05-06 MED ORDER — LORAZEPAM 2 MG/ML IJ SOLN
0.0000 mg | Freq: Four times a day (QID) | INTRAMUSCULAR | Status: DC
  Administered 2017-05-07: 1 mg via INTRAVENOUS
  Filled 2017-05-06: qty 1

## 2017-05-06 MED ORDER — THIAMINE HCL 100 MG/ML IJ SOLN
100.0000 mg | Freq: Every day | INTRAMUSCULAR | Status: DC
  Administered 2017-05-06 – 2017-05-10 (×5): 100 mg via INTRAVENOUS
  Filled 2017-05-06 (×2): qty 2
  Filled 2017-05-06 (×3): qty 1

## 2017-05-06 MED ORDER — LEVOFLOXACIN IN D5W 500 MG/100ML IV SOLN: 500.0000 mg | INTRAVENOUS | Status: DC

## 2017-05-06 MED ORDER — LORAZEPAM 2 MG/ML IJ SOLN
1.0000 mg | Freq: Four times a day (QID) | INTRAMUSCULAR | Status: DC | PRN
  Administered 2017-05-06: 1 mg via INTRAVENOUS
  Filled 2017-05-06: qty 1

## 2017-05-06 MED ORDER — FOLIC ACID 1 MG PO TABS
1.0000 mg | ORAL_TABLET | Freq: Every day | ORAL | Status: DC
  Administered 2017-05-06: 1 mg via ORAL
  Filled 2017-05-06 (×2): qty 1

## 2017-05-06 MED ORDER — ACETAMINOPHEN 650 MG RE SUPP
325.0000 mg | RECTAL | Status: DC | PRN
  Administered 2017-05-07: 650 mg via RECTAL
  Filled 2017-05-06 (×2): qty 1

## 2017-05-06 MED ORDER — VITAMIN B-1 100 MG PO TABS
100.0000 mg | ORAL_TABLET | Freq: Every day | ORAL | Status: DC
  Filled 2017-05-06 (×4): qty 1

## 2017-05-06 NOTE — Progress Notes (Signed)
Patient arrived to the unit via bed from the emergency department.  Patient is alert and oriented x 2 (person and place).  Patient has no complaints of pain.  Skin assessment complete.  IV intact to the left antecubital. Placed the patient on cardiac monitoring to box #1.  Patient is non-dependent to oxygen.  Pt arrived with 2LPM but oxygen levels in the lower 80's.  Increased oxygen to 4LPM to maintain o2 sat 95%.  Vitals signs stable. Due to confusion placed the fall risk armband on the patient.  Educated the patient and family (Daughter penny) on how to contact the staff on the unit.  Educated the family the person of the bed alarm and the importance of contacting the staff prior to getting up. Educated the family on the purpose of purwick.  Tech placed purwick on the patient for incontinence. Lowered the bed, activated the bed alarm and placed the call light within reach.

## 2017-05-06 NOTE — Progress Notes (Signed)
CRITICAL VALUE ALERT  Critical Value:  Troponin 0.03  Date & Time Notied:  05/06/17 0042  Provider Notified: Dr. Myna Hidalgo  Orders Received/Actions taken:: n/A

## 2017-05-06 NOTE — Procedures (Signed)
ELECTROENCEPHALOGRAM REPORT  Date of Study: 05/06/2017  Patient's Name: Kelly Wolfe MRN: [id number] Date of Birth: [birth year]  Referring Provider: Phillips Grout, MD  Clinical History: 83 year old woman found confused.  Medications: acetaminophen (TYLENOL) suppository 834 mg  folic acid (FOLVITE) tablet 1 mg  L1levofloxacin (LEVAQUIN) IVPB 500 mg  LORazepam (ATIVAN) injection 0-4 mg  thiamine (B-1)   Technical Summary: A multichannel digital EEG recording measured by the international 10-20 system with electrodes applied with paste and impedances below 5000 ohms performed in our laboratory with EKG monitoring in an awake and drowsy patient.  Hyperventilation and photic stimulation were not performed.  The digital EEG was referentially recorded, reformatted, and digitally filtered in a variety of bipolar and referential montages for optimal display.    Description: The patient is awake and drowsy during the recording.  During maximal wakefulness, there is a symmetric, medium voltage 9 Hz posterior dominant rhythm that attenuates with eye opening.  The record is symmetric.  During drowsiness, there is an increase in theta slowing of the background.  Stage 2 sleep was not seen.  There were no epileptiform discharges or electrographic seizures seen.    EKG lead was unremarkable.  Impression: This awake and drowsy EEG is normal.    Clinical Correlation: A normal EEG does not exclude a clinical diagnosis of epilepsy.  If further clinical questions remain, prolonged EEG may be helpful.  Clinical correlation is advised.   Metta Clines, DO

## 2017-05-06 NOTE — Progress Notes (Signed)
CRITICAL VALUE ALERT  Critical Value:  WBC 0.09  Date & Time Notied:  05/06/17 0546  Provider Notified: Dr. Darene Lamer. Opyd   Orders Received/Actions taken: Levaquin ; Cultue, expectorated sputum ; blood culture and differential.

## 2017-05-06 NOTE — Progress Notes (Signed)
EEG completed; results pending.    

## 2017-05-06 NOTE — Progress Notes (Signed)
[facility] Progress Note  Patient: Kelly Wolfe TDV:[id number]   PCP: Leighton Ruff, MD DOB: 26-Jul-1931   DOA: 05/09/2017   DOS: 05/06/2017   Date of Service: the patient was seen and examined on 05/06/2017  Subjective: Feeling better but still has difficulty speaking. Also has confusion and not back to her baseline per family. Cough present with expectoration. Spiked temperature last night. No nausea no vomiting. No abdominal pain. Does have loose bowel movement this morning and also had diarrhea at home for last 3 days.  Brief hospital course: Pt. with PMH of hypertension, pancytopenia, arthritis; admitted on 04/26/2017, presented with complaint of confusion, was found to have community-acquired pneumonia, suspected CVA, worsening bicytopenia. Currently further plan is continue current management and further workup.  Assessment and Plan: 1. Acute encephalopathy. Likely in the setting of pneumonia and dehydration. Per family patient does have improvement in her mentation but still not back to her baseline. No significant focal deficit on examination but does have generalized lethargy as well as expressive aphasia. CT head unremarkable, MRI brain currently pending. A carotid Doppler and echo ordered. PTOT consulted. Speech therapy but indicated since the patient has passed swallowing test. Unable to provide aspirin due to severe thrombocytopenia.  2. Community-acquired pneumonia. Acute hypoxic respiratory failure. Patient spiked fever last night, x-ray shows possible pneumonia. Blood cultures performed. Currently on Levaquin. We'll monitor.  3. 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 Follows up with hematology as an outpatient. Neutrophil count has worsened in the hospital this is likely in the setting of infection. We will continue to monitor and treat the underlying illness. Platelet counts are low but no active bleeding of the CT scan. MRI currently pending. Transfuse for  platelets less than 10. Or active bleeding.  4. Diarrhea. Dehydration with acute kidney injury. Diarrhea present on admission for last 3 days. Patient has 2 loose BM today. If she has another BM Will check for C. difficile given her neutropenia. Also sent for GI pathogen panel. Likely diarrhea cause dehydration as well as acute kidney injury. Getting better with IV hydration as well as improvement in diarrhea. A monitor.  5. Alcohol use. Family mentions of the patient drinks alcohol on a daily basis. Unable to specify the amount but H&P mentions that the patient tends 4.2 ounces per week. Currently on CIWA protocol without evidence of withdrawal monitor.  6. Hypotension. Essential hypertension. Blood pressure medications currently on hold. We'll monitor.  Diet: Cardiac diet DVT Prophylaxis: mechanical compression device  Advance goals of care discussion: full code  Family Communication: family was present at bedside, at the time of interview. The pt provided permission to discuss medical plan with the family. Opportunity was given to ask question and all questions were answered satisfactorily.   Disposition:  Discharge to home.  Consultants: none Procedures: Echocardiogram  Antibiotics: Anti-infectives    Start     Dose/Rate Route Frequency Ordered Stop   05/08/17 1000  levofloxacin (LEVAQUIN) IVPB 500 mg    Comments:  Levaquin 500 mg IV q48h for CrCL < 30 mL/min   500 mg 100 mL/hr over 60 Minutes Intravenous Every 48 hours 05/06/17 0612     05/06/17 0630  levofloxacin (LEVAQUIN) IVPB 750 mg     750 mg 100 mL/hr over 90 Minutes Intravenous  Once 05/06/17 0612 05/06/17 0824       Objective: Physical Exam: Vitals:   05/06/17 1015 05/06/17 1239 05/06/17 1415 05/06/17 1815  BP: (!) 113/59 (!) 111/57 (!) 116/55 (!) 150/66  Pulse: 81  77 83 88  Resp: 17 16 18 14   Temp: 97.9 F (36.6 C) 97.7 F (36.5 C) 98.3 F (36.8 C) 99.8 F (37.7 C)  TempSrc:  Oral Oral   SpO2: 100%   96% 95%  Weight:      Height:        Intake/Output Summary (Last 24 hours) at 05/06/17 1908 Last data filed at 05/06/17 1500  Gross per 24 hour  Intake             1650 ml  Output              150 ml  Net             1500 ml   Filed Weights   05/02/2017 2331  Weight: 50.8 kg (111 lb 14.4 oz)   General: Alert, Awake and Oriented to Time, Place and Person. Appear in mild distress, affect flat Eyes: PERRL, Conjunctiva normal ENT: Oral Mucosa clear moist. Neck: difficult to assess JVD, no Abnormal Mass Or lumps Cardiovascular: S1 and S2 Present, aortic systolic Murmur, Peripheral Pulses Present Respiratory: normal respiratory effort, Bilateral Air entry equal and Decreased, no use of accessory muscle, bilateral Crackles, no wheezes Abdomen: Bowel Sound present, Soft and no tenderness, no hernia Skin: no redness, no Rash, no induration Extremities: no Pedal edema, no calf tenderness Neurologic: Grossly no focal neuro deficit. Bilaterally Equal motor strength  Data Reviewed: CBC:  Recent Labs Lab 04/30/2017 1753 05/06/17 0506 05/06/17 0645  WBC 1.5* 0.9*  --   NEUTROABS 1.3*  --  1.0*  HGB 11.1* 11.7*  --   HCT 33.2* 34.4*  --   MCV 107.1* 105.5*  --   PLT 41* 38*  --    Basic Metabolic Panel:  Recent Labs Lab 05/09/2017 1753 05/06/17 0506  NA 136 139  K 3.9 3.7  CL 106 110  CO2 22 20*  GLUCOSE 124* 106*  BUN 36* 33*  CREATININE 1.70* 1.35*  CALCIUM 7.7* 7.8*    Liver Function Tests:  Recent Labs Lab 04/28/2017 1753  AST 228*  ALT 44  ALKPHOS 90  BILITOT 0.9  PROT 4.6*  ALBUMIN 2.9*   No results for input(s): LIPASE, AMYLASE in the last 168 hours.  Recent Labs Lab 04/28/2017 2103  AMMONIA 17   Coagulation Profile: No results for input(s): INR, PROTIME in the last 168 hours. Cardiac Enzymes:  Recent Labs Lab 05/02/2017 2204 05/13/2017 2320 05/06/17 0506 05/06/17 1333  TROPONINI <0.03 0.03* 0.06* 0.06*   BNP (last 3 results) No results for  input(s): PROBNP in the last 8760 hours. CBG:  Recent Labs Lab 05/10/2017 1807  GLUCAP 92   Studies: Dg Chest 2 View  Result Date: 04/20/2017 CLINICAL DATA:  Altered mental status. EXAM: CHEST  2 VIEW COMPARISON:  None. FINDINGS: Cardiomediastinal silhouette is normal, calcified aortic knob. Low lung volumes within diffusely prominent interstitium, no pleural effusion or focal consolidation. No pneumothorax. Osteopenia. Soft tissue planes and included osseous structures are nonsuspicious. IMPRESSION: Interstitial prominence concern for atypical infection without focal consolidation. Electronically Signed   By: Elon Alas M.D.   On: 05/12/2017 22:26    Scheduled Meds: . folic acid  1 mg Oral Daily  . LORazepam  0-4 mg Intravenous Q6H   Followed by  . [START ON 05/08/2017] LORazepam  0-4 mg Intravenous Q12H  . multivitamin with minerals  1 tablet Oral Daily  . sodium chloride flush  3 mL Intravenous Q12H  . sodium chloride flush  3  mL Intravenous Q12H  . thiamine  100 mg Oral Daily   Or  . thiamine  100 mg Intravenous Daily   Continuous Infusions: . sodium chloride    . sodium chloride 100 mL/hr at 05/06/17 0830  . [START ON 05/08/2017] levofloxacin (LEVAQUIN) IV     PRN Meds: sodium chloride, acetaminophen, LORazepam **OR** LORazepam, sodium chloride flush  Time spent: 35 minutes  Author: Berle Mull, MD [facility] Pager: [phone] 05/06/2017 7:08 PM  If 7PM-7AM, please contact night-coverage at [email or web address], password [facility]

## 2017-05-06 NOTE — Progress Notes (Signed)
Orders for swallow/speech eval received and then cancelled.  Please reorder if indicated.  RN reports pt passed RNSSS.   Luanna Salk, [facility] SLP [phone]

## 2017-05-06 NOTE — Progress Notes (Signed)
*  PRELIMINARY RESULTS* Vascular Ultrasound Carotid Duplex has been completed.  Preliminary findings: Bilateral: No significant (1-39%) ICA stenosis. Antegrade vertebral flow.    Landry Mellow, RDMS, RVT  05/06/2017, 11:58 AM

## 2017-05-06 NOTE — Progress Notes (Addendum)
Pt admitted last night with acute encephalopathy, noted to have mild neutropenia with ANC 1,300. She was afebrile on admission. Following with oncology outpatient with concern for mild MDS, not under treatment.   AM labs with worsening in leukopenia with WBC 1,500 --> 900. She has now spiked a temp as well. CXR from last night concerning for possible atypical PNA and she has a new supplemental O2 requirement. No tachycardia, no hypotension.   Plan to add-on differential to am CBC, culture blood and sputum, start empiric Levaquin. Discussed plan to RN.

## 2017-05-06 NOTE — Evaluation (Signed)
Physical Therapy Evaluation Patient Details Name: Kelly Wolfe MRN: [id number] DOB: 02/05/1931 Today's Date: 05/06/2017   History of Present Illness  pt is an 83 y/o female with pmh significant for HTN, neurtopenia, OA and spinal stenosis, presenting to the ED due to being found down unresponsive on her porch by a non family member.  After arrival to ED pt was slow to respond to questions.  Dtr states pt not at her normal mentation.  MRI ordered.  Clinical Impression  Pt admitted with/for being found down at home unresponsive, now confused and needing min assist for mobility where was independent PTA.  Pt currently limited functionally due to the problems listed below.  (see problems list.)  Pt will benefit from PT to maximize function and safety to be able to get home safely with available assist of family.      Follow Up Recommendations Home health PT;Supervision for mobility/OOB    Equipment Recommendations  Other (comment) (TBA)    Recommendations for Other Services       Precautions / Restrictions Precautions Precautions: Fall      Mobility  Bed Mobility Overal bed mobility: Needs Assistance Bed Mobility: Supine to Sit;Sit to Supine     Supine to sit: Min assist Sit to supine: Min assist   General bed mobility comments: cues for sequencing and truncal limb assist.  Pt having trouble problem solving/planning.  Transfers Overall transfer level: Needs assistance   Transfers: Sit to/from Stand Sit to Stand: Min assist         General transfer comment: assist coming forward.  cues for guidance  Ambulation/Gait Ambulation/Gait assistance: Min assist Ambulation Distance (Feet): 15 Feet (x2, further deferred due to incontinence) Assistive device: 1 person hand held assist (iv pole) Gait Pattern/deviations: Step-through pattern   Gait velocity interpretation: Below normal speed for age/gender General Gait Details: small steps, guarded,  needed v/t cues for  guidance.  Stairs            Wheelchair Mobility    Modified Rankin (Stroke Patients Only)       Balance Overall balance assessment: Needs assistance Sitting-balance support: Single extremity supported;Bilateral upper extremity supported Sitting balance-Leahy Scale: Poor Sitting balance - Comments: falling posteriorly Postural control: Posterior lean Standing balance support: Single extremity supported Standing balance-Leahy Scale: Poor Standing balance comment: needing external support                             Pertinent Vitals/Pain Pain Assessment: Faces Faces Pain Scale: No hurt    Home Living Family/patient expects to be discharged to:: Private residence Living Arrangements: Alone Available Help at Discharge: Family;Available 24 hours/day Type of Home: House Home Access: Stairs to enter Entrance Stairs-Rails: Psychiatric nurse of Steps: several Home Layout: One level Home Equipment: None      Prior Function Level of Independence: Independent         Comments: drives, runs errands, family mowes the yard etc.     Hand Dominance        Extremity/Trunk Assessment   Upper Extremity Assessment Upper Extremity Assessment:  (strength functional, but pt not initiating or executing  mvt)    Lower Extremity Assessment Lower Extremity Assessment: Overall WFL for tasks assessed (but overall weak at 4/5)       Communication   Communication: No difficulties  Cognition Arousal/Alertness: Awake/alert Behavior During Therapy: WFL for tasks assessed/performed Overall Cognitive Status: Impaired/Different from baseline Area of Impairment: Attention;Following  commands;Awareness;Problem solving                   Current Attention Level: Sustained   Following Commands: Follows one step commands with increased time   Awareness: Emergent Problem Solving: Slow processing;Decreased initiation        General Comments  General comments (skin integrity, edema, etc.): sats 93% on RA at 91 bpm    Exercises     Assessment/Plan    PT Assessment Patient needs continued PT services  PT Problem List Decreased strength;Decreased activity tolerance;Decreased balance;Decreased mobility;Decreased safety awareness       PT Treatment Interventions Gait training;Stair training;Functional mobility training;Therapeutic activities;Patient/family education    PT Goals (Current goals can be found in the Care Plan section)  Acute Rehab PT Goals Patient Stated Goal: back living alone as PTA PT Goal Formulation: With patient/family Time For Goal Achievement: 05/20/17 Potential to Achieve Goals: Good    Frequency Min 3X/week   Barriers to discharge        Co-evaluation               AM-PAC PT "6 Clicks" Daily Activity  Outcome Measure Difficulty turning over in bed (including adjusting bedclothes, sheets and blankets)?: Total Difficulty moving from lying on back to sitting on the side of the bed? : Total Difficulty sitting down on and standing up from a chair with arms (e.g., wheelchair, bedside commode, etc,.)?: Total Help needed moving to and from a bed to chair (including a wheelchair)?: A Little Help needed walking in hospital room?: A Little Help needed climbing 3-5 steps with a railing? : A Little 6 Click Score: 12    End of Session   Activity Tolerance: Patient tolerated treatment well Patient left: in bed;with call bell/phone within reach;with bed alarm set;with family/visitor present Nurse Communication: Mobility status PT Visit Diagnosis: Unsteadiness on feet (R26.81);Difficulty in walking, not elsewhere classified (R26.2)    Time: 3716-9678 PT Time Calculation (min) (ACUTE ONLY): 52 min   Charges:   PT Evaluation $PT Eval Moderate Complexity: 1 Procedure PT Treatments $Gait Training: 8-22 mins $Therapeutic Activity: 8-22 mins   PT G Codes:   PT G-Codes **NOT FOR INPATIENT  CLASS** Functional Assessment Tool Used: AM-PAC 6 Clicks Basic Mobility;Clinical judgement Functional Limitation: Mobility: Walking and moving around Mobility: Walking and Moving Around Current Status (L3810): At least 20 percent but less than 40 percent impaired, limited or restricted Mobility: Walking and Moving Around Goal Status [phone]): At least 1 percent but less than 20 percent impaired, limited or restricted    05/06/2017  Donnella Sham, PT [phone]  (pager)  Kelly Wolfe 05/06/2017, 6:31 PM

## 2017-05-07 ENCOUNTER — Inpatient Hospital Stay (HOSPITAL_COMMUNITY): Payer: Medicare HMO

## 2017-05-07 ENCOUNTER — Observation Stay (HOSPITAL_COMMUNITY): Payer: Medicare HMO

## 2017-05-07 DIAGNOSIS — I360 Nonrheumatic tricuspid (valve) stenosis: Secondary | ICD-10-CM | POA: Diagnosis not present

## 2017-05-07 DIAGNOSIS — K810 Acute cholecystitis: Secondary | ICD-10-CM | POA: Diagnosis present

## 2017-05-07 DIAGNOSIS — L8990 Pressure ulcer of unspecified site, unspecified stage: Secondary | ICD-10-CM | POA: Diagnosis not present

## 2017-05-07 DIAGNOSIS — I5181 Takotsubo syndrome: Secondary | ICD-10-CM | POA: Diagnosis present

## 2017-05-07 DIAGNOSIS — J189 Pneumonia, unspecified organism: Secondary | ICD-10-CM | POA: Diagnosis present

## 2017-05-07 DIAGNOSIS — R55 Syncope and collapse: Secondary | ICD-10-CM | POA: Diagnosis not present

## 2017-05-07 DIAGNOSIS — R4182 Altered mental status, unspecified: Secondary | ICD-10-CM | POA: Diagnosis not present

## 2017-05-07 DIAGNOSIS — Z515 Encounter for palliative care: Secondary | ICD-10-CM | POA: Diagnosis not present

## 2017-05-07 DIAGNOSIS — I110 Hypertensive heart disease with heart failure: Secondary | ICD-10-CM | POA: Diagnosis present

## 2017-05-07 DIAGNOSIS — R0602 Shortness of breath: Secondary | ICD-10-CM | POA: Diagnosis not present

## 2017-05-07 DIAGNOSIS — R339 Retention of urine, unspecified: Secondary | ICD-10-CM | POA: Diagnosis present

## 2017-05-07 DIAGNOSIS — R4701 Aphasia: Secondary | ICD-10-CM | POA: Diagnosis not present

## 2017-05-07 DIAGNOSIS — J309 Allergic rhinitis, unspecified: Secondary | ICD-10-CM | POA: Diagnosis present

## 2017-05-07 DIAGNOSIS — G9341 Metabolic encephalopathy: Secondary | ICD-10-CM | POA: Diagnosis not present

## 2017-05-07 DIAGNOSIS — R197 Diarrhea, unspecified: Secondary | ICD-10-CM | POA: Diagnosis present

## 2017-05-07 DIAGNOSIS — E785 Hyperlipidemia, unspecified: Secondary | ICD-10-CM | POA: Diagnosis present

## 2017-05-07 DIAGNOSIS — A419 Sepsis, unspecified organism: Secondary | ICD-10-CM | POA: Diagnosis not present

## 2017-05-07 DIAGNOSIS — D509 Iron deficiency anemia, unspecified: Secondary | ICD-10-CM | POA: Diagnosis not present

## 2017-05-07 DIAGNOSIS — R7989 Other specified abnormal findings of blood chemistry: Secondary | ICD-10-CM | POA: Diagnosis not present

## 2017-05-07 DIAGNOSIS — K7200 Acute and subacute hepatic failure without coma: Secondary | ICD-10-CM | POA: Diagnosis not present

## 2017-05-07 DIAGNOSIS — R6520 Severe sepsis without septic shock: Secondary | ICD-10-CM | POA: Diagnosis present

## 2017-05-07 DIAGNOSIS — E860 Dehydration: Secondary | ICD-10-CM | POA: Diagnosis present

## 2017-05-07 DIAGNOSIS — Z66 Do not resuscitate: Secondary | ICD-10-CM | POA: Diagnosis present

## 2017-05-07 DIAGNOSIS — R410 Disorientation, unspecified: Secondary | ICD-10-CM | POA: Diagnosis not present

## 2017-05-07 DIAGNOSIS — R945 Abnormal results of liver function studies: Secondary | ICD-10-CM | POA: Diagnosis not present

## 2017-05-07 DIAGNOSIS — I639 Cerebral infarction, unspecified: Secondary | ICD-10-CM | POA: Diagnosis not present

## 2017-05-07 DIAGNOSIS — K819 Cholecystitis, unspecified: Secondary | ICD-10-CM | POA: Diagnosis present

## 2017-05-07 DIAGNOSIS — J9601 Acute respiratory failure with hypoxia: Secondary | ICD-10-CM | POA: Diagnosis not present

## 2017-05-07 DIAGNOSIS — N179 Acute kidney failure, unspecified: Secondary | ICD-10-CM | POA: Diagnosis not present

## 2017-05-07 DIAGNOSIS — I959 Hypotension, unspecified: Secondary | ICD-10-CM | POA: Diagnosis present

## 2017-05-07 DIAGNOSIS — Z7189 Other specified counseling: Secondary | ICD-10-CM | POA: Diagnosis not present

## 2017-05-07 DIAGNOSIS — D61818 Other pancytopenia: Secondary | ICD-10-CM | POA: Diagnosis not present

## 2017-05-07 DIAGNOSIS — I5021 Acute systolic (congestive) heart failure: Secondary | ICD-10-CM | POA: Diagnosis not present

## 2017-05-07 LAB — COMPREHENSIVE METABOLIC PANEL
ALK PHOS: 159 U/L — AB (ref 38–126)
ALT: 57 U/L — AB (ref 14–54)
ANION GAP: 10 (ref 5–15)
AST: 446 U/L — ABNORMAL HIGH (ref 15–41)
Albumin: 2.5 g/dL — ABNORMAL LOW (ref 3.5–5.0)
BILIRUBIN TOTAL: 1 mg/dL (ref 0.3–1.2)
BUN: 26 mg/dL — ABNORMAL HIGH (ref 6–20)
CALCIUM: 7.7 mg/dL — AB (ref 8.9–10.3)
CO2: 18 mmol/L — AB (ref 22–32)
CREATININE: 0.96 mg/dL (ref 0.44–1.00)
Chloride: 111 mmol/L (ref 101–111)
GFR calc non Af Amer: 53 mL/min — ABNORMAL LOW (ref >60)
Glucose, Bld: 117 mg/dL — ABNORMAL HIGH (ref 65–99)
Potassium: 3.7 mmol/L (ref 3.5–5.1)
SODIUM: 139 mmol/L (ref 135–145)
TOTAL PROTEIN: 4.5 g/dL — AB (ref 6.5–8.1)

## 2017-05-07 LAB — GLUCOSE, CAPILLARY
GLUCOSE-CAPILLARY: 108 mg/dL — AB (ref 65–99)
GLUCOSE-CAPILLARY: 174 mg/dL — AB (ref 65–99)
GLUCOSE-CAPILLARY: 137 mg/dL — AB (ref 65–99)
GLUCOSE-CAPILLARY: 107 mg/dL — AB (ref 65–99)
Glucose-Capillary: 125 mg/dL — ABNORMAL HIGH (ref 65–99)

## 2017-05-07 LAB — CBC WITH DIFFERENTIAL/PLATELET
BASOS PCT: 4 %
Basophils Absolute: 0 10*3/uL (ref 0.0–0.1)
EOS ABS: 0 10*3/uL (ref 0.0–0.7)
EOS PCT: 0 %
HEMATOCRIT: 37.1 % (ref 36.0–46.0)
HEMOGLOBIN: 12.8 g/dL (ref 12.0–15.0)
LYMPHS PCT: 28 %
Lymphs Abs: 0.1 10*3/uL — ABNORMAL LOW (ref 0.7–4.0)
MCH: 35.7 pg — AB (ref 26.0–34.0)
MCHC: 34.5 g/dL (ref 30.0–36.0)
MCV: 103.3 fL — AB (ref 78.0–100.0)
MONOS PCT: 16 %
Monocytes Absolute: 0.1 10*3/uL (ref 0.1–1.0)
NEUTROS ABS: 0.3 10*3/uL — AB (ref 1.7–7.7)
NEUTROS PCT: 52 %
Platelets: 50 10*3/uL — ABNORMAL LOW (ref 150–400)
RBC: 3.59 MIL/uL — ABNORMAL LOW (ref 3.87–5.11)
RDW: 12.9 % (ref 11.5–15.5)
WBC: 0.5 10*3/uL — CL (ref 4.0–10.5)

## 2017-05-07 LAB — BLOOD GAS, ARTERIAL
ACID-BASE DEFICIT: 7.9 mmol/L — AB (ref 0.0–2.0)
BICARBONATE: 15.5 mmol/L — AB (ref 20.0–28.0)
DRAWN BY: 290171
FIO2: 40
O2 Saturation: 88.8 %
Patient temperature: 98.6
pCO2 arterial: 23.5 mmHg — ABNORMAL LOW (ref 32.0–48.0)
pH, Arterial: 7.436 (ref 7.350–7.450)
pO2, Arterial: 57.3 mmHg — ABNORMAL LOW (ref 83.0–108.0)

## 2017-05-07 LAB — ECHOCARDIOGRAM COMPLETE
FS: 21 % — AB (ref 28–44)
Height: 60 in
IV/PV OW: 0.95
LA diam end sys: 28 mm
LADIAMINDEX: 1.85 cm/m2
LASIZE: 28 mm
LAVOL: 43.3 mL
LAVOLA4C: 43.3 mL
LAVOLIN: 28.7 mL/m2
LDCA: 2.84 cm2
LV PW d: 8.05 mm — AB (ref 0.6–1.1)
LVOTD: 19 mm
RV sys press: 32 mmHg
Reg peak vel: 267 cm/s
TRMAXVEL: 267 cm/s
Weight: 1957.68 oz

## 2017-05-07 LAB — LIPID PANEL
Cholesterol: 89 mg/dL (ref 0–200)
HDL: 10 mg/dL — AB (ref >40)
LDL CALC: 31 mg/dL (ref 0–99)
TRIGLYCERIDES: 240 mg/dL — AB (ref <150)
Total CHOL/HDL Ratio: 8.9 RATIO
VLDL: 48 mg/dL — ABNORMAL HIGH (ref 0–40)

## 2017-05-07 LAB — LACTIC ACID, PLASMA
LACTIC ACID, VENOUS: 1.6 mmol/L (ref 0.5–1.9)
LACTIC ACID, VENOUS: 1.3 mmol/L (ref 0.5–1.9)

## 2017-05-07 LAB — MRSA PCR SCREENING: MRSA by PCR: NEGATIVE

## 2017-05-07 MED ORDER — SODIUM CHLORIDE 0.9 % IV SOLN
Freq: Once | INTRAVENOUS | Status: AC
  Administered 2017-05-07: 250 mL via INTRAVENOUS

## 2017-05-07 MED ORDER — ALBUMIN HUMAN 5 % IV SOLN
12.5000 g | Freq: Once | INTRAVENOUS | Status: AC
  Administered 2017-05-07: 12.5 g via INTRAVENOUS
  Filled 2017-05-07: qty 250

## 2017-05-07 MED ORDER — SODIUM CHLORIDE 0.9 % IV BOLUS (SEPSIS)
500.0000 mL | Freq: Once | INTRAVENOUS | Status: AC
  Administered 2017-05-07: 500 mL via INTRAVENOUS

## 2017-05-07 MED ORDER — DEXTROSE 5 % IV SOLN
0.0000 ug/min | INTRAVENOUS | Status: DC
  Administered 2017-05-07: 5 ug/min via INTRAVENOUS
  Filled 2017-05-07: qty 4

## 2017-05-07 MED ORDER — FUROSEMIDE 10 MG/ML IJ SOLN
20.0000 mg | Freq: Once | INTRAMUSCULAR | Status: AC
  Administered 2017-05-07: 20 mg via INTRAVENOUS
  Filled 2017-05-07: qty 2

## 2017-05-07 MED ORDER — TBO-FILGRASTIM 480 MCG/0.8ML ~~LOC~~ SOSY
480.0000 ug | PREFILLED_SYRINGE | Freq: Once | SUBCUTANEOUS | Status: AC
  Administered 2017-05-07: 480 ug via SUBCUTANEOUS
  Filled 2017-05-07: qty 0.8

## 2017-05-07 MED ORDER — FUROSEMIDE 10 MG/ML IJ SOLN
20.0000 mg | Freq: Two times a day (BID) | INTRAMUSCULAR | Status: DC
Start: 2017-05-08 — End: 2017-05-08
  Administered 2017-05-08: 20 mg via INTRAVENOUS
  Filled 2017-05-07: qty 2

## 2017-05-07 MED ORDER — VANCOMYCIN HCL IN DEXTROSE 1-5 GM/200ML-% IV SOLN
1000.0000 mg | Freq: Once | INTRAVENOUS | Status: AC
  Administered 2017-05-07: 1000 mg via INTRAVENOUS
  Filled 2017-05-07: qty 200

## 2017-05-07 MED ORDER — CEFEPIME HCL 1 G IJ SOLR
1.0000 g | INTRAMUSCULAR | Status: DC
  Administered 2017-05-07 – 2017-05-08 (×2): 1 g via INTRAVENOUS
  Filled 2017-05-07 (×3): qty 1

## 2017-05-07 MED ORDER — LEVALBUTEROL HCL 0.63 MG/3ML IN NEBU
0.6300 mg | INHALATION_SOLUTION | Freq: Four times a day (QID) | RESPIRATORY_TRACT | Status: DC
  Administered 2017-05-07 – 2017-05-11 (×13): 0.63 mg via RESPIRATORY_TRACT
  Filled 2017-05-07 (×28): qty 3

## 2017-05-07 MED ORDER — ACETAMINOPHEN 650 MG RE SUPP: 650.0000 mg | Freq: Once | RECTAL | Status: DC

## 2017-05-07 MED ORDER — VANCOMYCIN HCL 500 MG IV SOLR
500.0000 mg | INTRAVENOUS | Status: DC
  Administered 2017-05-08: 500 mg via INTRAVENOUS
  Filled 2017-05-07 (×2): qty 500

## 2017-05-07 MED ORDER — ALBUMIN HUMAN 5 % IV SOLN: 25.0000 g | Freq: Once | INTRAVENOUS | Status: DC

## 2017-05-07 MED ORDER — SODIUM CHLORIDE 0.9 % IV BOLUS (SEPSIS): 500.0000 mL | Freq: Once | INTRAVENOUS | Status: DC

## 2017-05-07 NOTE — Progress Notes (Signed)
CCMD notified nurse of HR elevation. Pt had increased respirations and letharthic. O2 saturation 87 on 4L. Rapid Response called. Patient placed on nonrebreather  Mask. Rectal temp elevated. ABG and chest xray ordered. MD notified. Pt transfer to 2M08 via bed on BiPap with RN and Respiratory therapy. Daughter notified.

## 2017-05-07 NOTE — Progress Notes (Signed)
  Echocardiogram 2D Echocardiogram has been performed.  Kelly Wolfe 05/07/2017, 12:36 PM

## 2017-05-07 NOTE — Progress Notes (Addendum)
[facility] Progress Note  Patient: Kelly Wolfe ZOX:[id number]   PCP: Leighton Ruff, MD DOB: 06-08-1931   DOA: 04/25/2017   DOS: 05/07/2017   Date of Service: the patient was seen and examined on 05/07/2017  Subjective: Overnight the patient become unresponsive, developed severe hypoxia and need to be transferred to step down unit and was placed on BiPAP. In morning the patient was initially unresponsive but later on was able to open up her eyes and track.  Brief hospital course: Pt. with PMH of hypertension, pancytopenia, arthritis; admitted on 04/21/2017, presented with complaint of confusion, was found to have community-acquired pneumonia, suspected CVA, worsening bicytopenia. Currently further plan is continue current care and management in the step down unit.  Assessment and Plan: 1. Acute hypoxic respiratory failure. Community-acquired pneumonia. Currently requiring nonrebreather to maintain adequate saturation. X-ray chest this morning shows small pleural effusion as well as some vascular congestion. initially had fever continues to have leukopenia. Blood cultures so far negative. We will broaden the antibiotic coverage, vancomycin and cefepime from the Levaquin. Duo nebs as well as pulmonary toilet. BiPAP when necessary.  2. Acute encephalopathy. Overnight patient had worsening of her mental status likely from IV Ativan. Patient received 1 mg Ativan at 10:30 and 1:15 last night. Following this the patient will come unresponsive. At present we will continue supportive management. No indication to reverse benzodiazepine. Continue to hold secondary to medications.  3. Suspected CVA. Delirium in the setting of sepsis. Patient presented with confusion in the hospital. This was thought secondary to delirium. CT head unremarkable. I'm still suspecting a possible CVA and therefore performing an MRI of the brain. Given to the patient is relatively unstable and perform a stat CT  to rule out any acute abnormality for overnight confusion. Unfortunately cannot use antiplatelet medication given, cytopenia. Carotid Doppler unremarkable. Speech therapy consult, PT OT consulted.  4. 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 Follows up with hematology as an outpatient. Severe neutropenia right now with worsening in the setting of infection. His course with on-call hematology, we will attempt Granix injection, full dose to see if that improves patient's count. Monitor platelets as well. Transfuse as needed for platelets less than 10 or any active bleeding. Getting CT head to rule out any acute intracranial bleeding.  5. Diarrhea. Dehydration with acute kidney injury. Patient had diarrhea at home, no further bowel movement here after yesterday's 2 loose BM. Monitor. Acute kidney injury is significantly better after aggressive IV hydration.  6. Takatsubo cardiomyopathy. Patient presented with pneumonia, diarrhea, acute kidney injury, acute encephalopathy. Overnight developed worsening hypoxia requiring BiPAP. Echo performed which shows EF of 25% with diffuse hypokinesis other than inferior wall. Discussed with cardiology, this is consistent with Takatsubo cardiomyopathy and recommended supportive management. Unfortunately due to hypotension cannot tolerate beta blocker. Patient did receive multiple episodes bolus and may have mild volume overload, currently we'll stop IV fluid and use albumin Support Pressures. Treating Underlying Infection As Well As Respiratory Distress.  7. Hypotension. In the setting of sepsis, received IV fluid bolus. Currently relatively stable. If the blood pressure improves further would actually like to use IV Lasix to maintain the patient on driver's side for respiratory distress. Also in the setting of Takatsubo cardiomyopathy. Monitor.  8. Goals of care discussion. Patient has a living will that mentions"desire for natural death". It also  clearly mentions regarding avoiding artificial feeding, CPR, respirator. Discussed with patient's daughters, Jeannene Patella And [name], changing CODE STATUS to partial. Explained poor prognosis with best scenario  would be improvement in next 24 hours in her mentation and blood pressure if it is all driven by Ativan. We will reevaluate, may require palliative care input. Patient has expressed wishes on her living will for comfort measures in event of terminal illness.  Diet: Currently nothing by mouth until fully awake. DVT Prophylaxis: mechanical compression device  Advance goals of care discussion: partial, based on the living will and discussion with family. Continue BiPAP.  Family Communication: family was present at bedside, at the time of interview. The pt provided permission to discuss medical plan with the family. Opportunity was given to ask question and all questions were answered satisfactorily.   Disposition:  Discharge to be determined.  Consultants: Phone consultation with CCM, hematology. Procedures: Echocardiogram, BIPAP  Antibiotics: Anti-infectives    Start     Dose/Rate Route Frequency Ordered Stop   05/08/17 1000  levofloxacin (LEVAQUIN) IVPB 500 mg  Status:  Discontinued    Comments:  Levaquin 500 mg IV q48h for CrCL < 30 mL/min   500 mg 100 mL/hr over 60 Minutes Intravenous Every 48 hours 05/06/17 0612 05/07/17 0751   05/08/17 0900  vancomycin (VANCOCIN) 500 mg in sodium chloride 0.9 % 100 mL IVPB     500 mg 100 mL/hr over 60 Minutes Intravenous Every 24 hours 05/07/17 0836     05/07/17 0900  vancomycin (VANCOCIN) IVPB 1000 mg/200 mL premix     1,000 mg 200 mL/hr over 60 Minutes Intravenous  Once 05/07/17 0836 05/07/17 1119   05/07/17 0900  ceFEPIme (MAXIPIME) 1 g in dextrose 5 % 50 mL IVPB     1 g 100 mL/hr over 30 Minutes Intravenous Every 24 hours 05/07/17 0836     05/06/17 0630  levofloxacin (LEVAQUIN) IVPB 750 mg     750 mg 100 mL/hr over 90 Minutes Intravenous   Once 05/06/17 0612 05/06/17 0824       Objective: Physical Exam: Vitals:   05/07/17 1530 05/07/17 1600 05/07/17 1617 05/07/17 1630  BP: 103/63 103/76  116/78  Pulse:  (!) 109  100  Resp: (!) 23 (!) 29  (!) 29  Temp:   97.3 F (36.3 C)   TempSrc:   Oral   SpO2:  95%    Weight:      Height:        Intake/Output Summary (Last 24 hours) at 05/07/17 1706 Last data filed at 05/07/17 1500  Gross per 24 hour  Intake          1613.17 ml  Output              500 ml  Net          1113.17 ml   Filed Weights   04/21/2017 2331 05/07/17 0500  Weight: 50.8 kg (111 lb 14.4 oz) 55.5 kg (122 lb 5.7 oz)   General: Drowsy, lethargic, unable to follow command, occasionally opening her eyes and track. Appear in severe distress, affect difficult to assess Eyes: PERRL, Conjunctiva normal ENT: Oral Mucosa clear moist Neck: difficult to assess JVD, no Abnormal Mass Or lumps Cardiovascular: S1 and S2 Present, no Murmur, Peripheral Pulses Present Respiratory: increased respiratory effort, Bilateral Air entry equal and Decreased, positive use of accessory muscle, bilateral Crackles, expiratory wheezes Abdomen: Bowel Sound present, Soft and no tenderness, no hernia Skin: no redness, no Rash, no induration Extremities: no Pedal edema, no calf tenderness Neurologic: Withdraws to painful stimuli, more than that is difficult to assess due to encephalopathy  Data Reviewed: CBC:  Recent Labs Lab 04/23/2017 1753 05/06/17 0506 05/06/17 0645 05/07/17 0354  WBC 1.5* 0.9*  --  0.5*  NEUTROABS 1.3*  --  1.0* 0.3*  HGB 11.1* 11.7*  --  12.8  HCT 33.2* 34.4*  --  37.1  MCV 107.1* 105.5*  --  103.3*  PLT 41* 38*  --  50*   Basic Metabolic Panel:  Recent Labs Lab 04/15/2017 1753 05/06/17 0506 05/07/17 0354  NA 136 139 139  K 3.9 3.7 3.7  CL 106 110 111  CO2 22 20* 18*  GLUCOSE 124* 106* 117*  BUN 36* 33* 26*  CREATININE 1.70* 1.35* 0.96  CALCIUM 7.7* 7.8* 7.7*    Liver Function Tests:  Recent  Labs Lab 05/10/2017 1753 05/07/17 0354  AST 228* 446*  ALT 44 57*  ALKPHOS 90 159*  BILITOT 0.9 1.0  PROT 4.6* 4.5*  ALBUMIN 2.9* 2.5*   No results for input(s): LIPASE, AMYLASE in the last 168 hours.  Recent Labs Lab 04/23/2017 2103  AMMONIA 17   Coagulation Profile: No results for input(s): INR, PROTIME in the last 168 hours. Cardiac Enzymes:  Recent Labs Lab 04/15/2017 2204 04/28/2017 2320 05/06/17 0506 05/06/17 1333  TROPONINI <0.03 0.03* 0.06* 0.06*   BNP (last 3 results) No results for input(s): PROBNP in the last 8760 hours. CBG:  Recent Labs Lab 04/21/2017 1807 05/07/17 0547 05/07/17 0825 05/07/17 1222 05/07/17 1619  GLUCAP 92 108* 125* 174* 137*   Studies: Dg Chest Port 1 View  Result Date: 05/07/2017 CLINICAL DATA:  Increase shortness-of-breath and fever. EXAM: PORTABLE CHEST 1 VIEW COMPARISON:  05/07/2017 FINDINGS: Lungs are adequately inflated demonstrate mild prominence of the perihilar markings without significant change. Mild increased hazy density over the lung bases which may be due to small effusions with atelectasis. Cardiomediastinal silhouette is within normal. There is calcified plaque over the aortic arch. Mild degenerative change of the spine. IMPRESSION: Persistent mild prominence of the perihilar markings suggesting mild vascular congestion. Mild bibasilar hazy density with blunting of the costophrenic angles suggesting small effusions/atelectasis. Aortic atherosclerosis. Electronically Signed   By: Marin Olp M.D.   On: 05/07/2017 09:16    Scheduled Meds: . acetaminophen  650 mg Rectal Once  . levalbuterol  0.63 mg Nebulization Q6H  . multivitamin with minerals  1 tablet Oral Daily  . sodium chloride flush  3 mL Intravenous Q12H  . sodium chloride flush  3 mL Intravenous Q12H  . thiamine  100 mg Oral Daily   Or  . thiamine  100 mg Intravenous Daily   Continuous Infusions: . sodium chloride    . ceFEPime (MAXIPIME) IV Stopped (05/07/17  1018)  . sodium chloride    . [START ON 05/08/2017] vancomycin     PRN Meds: sodium chloride, acetaminophen, sodium chloride flush  CRITICAL CARE Performed by: Berle Mull   Total critical care time: 40 minutes  Critical care time was exclusive of separately billable procedures and treating other patients.  Critical care was necessary to treat or prevent imminent or life-threatening deterioration.  Critical care was time spent personally by me on the following activities: development of treatment plan with patient and/or surrogate as well as nursing, discussions with consultants, evaluation of patient's response to treatment, examination of patient, obtaining history from patient or surrogate, ordering and performing treatments and interventions, ordering and review of laboratory studies, ordering and review of radiographic studies, pulse oximetry and re-evaluation of patient's condition.   Author: Berle Mull, MD [facility] Pager: [phone] 05/07/2017 5:06  PM  If 7PM-7AM, please contact night-coverage at [email or web address], password [facility]

## 2017-05-07 NOTE — Progress Notes (Signed)
RT removed bipap and placed on NRB per MD request. Patient tolerating well. No complications. RT will continue to monitor.

## 2017-05-07 NOTE — Progress Notes (Signed)
SLP Cancellation Note  Patient Details Name: [name] MRN: [id number] DOB: 09-Feb-1931   Cancelled treatment:       Reason Eval/Treat Not Completed: Patient not medically ready. Pt not appropriate for cognitive eval given acute medical needs. Will follow for readiness.    Tailyn Hantz, Katherene Ponto 05/07/2017, 7:50 AM

## 2017-05-07 NOTE — Evaluation (Signed)
Occupational Therapy Evaluation Patient Details Name: Kelly Wolfe MRN: [id number] DOB: Apr 14, 1931 Today's Date: 05/07/2017    History of Present Illness pt is an 83 y/o female with pmh significant for HTN, neurtopenia, OA and spinal stenosis, presenting to the ED due to being found down unresponsive on her porch by a non family member.  After arrival to ED pt was slow to respond to questions.  Dtr states pt not at her normal mentation.  MRI ordered. Early AM on 05/07/17 pt with reported decrease in LOC and dyspnea and transferred to 7M.   Clinical Impression   This 83 yo female admitted with above presents to acute OT with deficits below (see OT problem list) thus affecting her PLOF of being totally independent with basic ADLs and most IADLs. She will benefit from acute OT with follow up OT at SNF to work back to [facility].    Follow Up Recommendations  SNF;Supervision/Assistance - 24 hour    Equipment Recommendations  Other (comment) (TBD at next venue)       Precautions / Restrictions Precautions Precautions: Fall Restrictions Weight Bearing Restrictions: No      Mobility Bed Mobility Overal bed mobility: Needs Assistance Bed Mobility: Sit to Supine;Supine to Sit     Supine to sit: Total assist Sit to supine: Total assist   General bed mobility comments: Pt without initiation to help sit up or lay down  Transfers                 General transfer comment: unsafe at this point to do with +1 due to not A'ing at all with sitting balance    Balance Overall balance assessment: Needs assistance Sitting-balance support: No upper extremity supported;Feet supported Sitting balance-Leahy Scale: Zero Sitting balance - Comments: pt did not attempt to support herself while seated EOB                                   ADL either performed or assessed with clinical judgement   ADL Overall ADL's : Needs assistance/impaired                                        General ADL Comments: Pt currently total A for all basic ADLS     Vision Patient Visual Report: No change from baseline              Pertinent Vitals/Pain Pain Assessment: Faces Faces Pain Scale: No hurt     Hand Dominance Right   Extremity/Trunk Assessment Upper Extremity Assessment Upper Extremity Assessment: Generalized weakness (Full PROM)           Communication Communication Communication: No difficulties   Cognition Arousal/Alertness: Awake/alert Behavior During Therapy: WFL for tasks assessed/performed Overall Cognitive Status: Impaired/Different from baseline Area of Impairment: Orientation;Attention;Following commands;Safety/judgement;Problem solving                 Orientation Level:  (could not tell me any of this) Current Attention Level: Focused   Following Commands: Follows one step commands inconsistently Safety/Judgement: Decreased awareness of safety;Decreased awareness of deficits   Problem Solving: Slow processing;Decreased initiation;Difficulty sequencing;Requires verbal cues;Requires tactile cues General Comments: Pt followed command to raise each leg, but when asked to raise each arm she does not initiate on her own, but will move arms with A once initiated for  her              Home Living Family/patient expects to be discharged to:: Private residence Living Arrangements: Alone Available Help at Discharge: Family;Available 24 hours/day Type of Home: House Home Access: Stairs to enter [employer] of Steps: several Entrance Stairs-Rails: Right;Left Home Layout: One level         Biochemist, clinical: Standard     Home Equipment: None          Prior Functioning/Environment Level of Independence: Independent        Comments: drives, runs errands, family mowes the yard etc.        OT Problem List: Decreased strength;Decreased activity tolerance;Impaired balance (sitting and/or  standing);Decreased cognition;Decreased safety awareness      OT Treatment/Interventions: Self-care/ADL training;Patient/family education;DME and/or AE instruction;Balance training;Therapeutic activities    OT Goals(Current goals can be found in the care plan section) Acute Rehab OT Goals Patient Stated Goal: dtr--hopes that she can get back to living by herself again OT Goal Formulation: With family Time For Goal Achievement: 05/21/17 Potential to Achieve Goals: Good  OT Frequency: Min 2X/week   Barriers to D/C: Decreased caregiver support             AM-PAC PT "6 Clicks" Daily Activity     Outcome Measure Help from another person eating meals?: Total Help from another person taking care of personal grooming?: Total Help from another person toileting, which includes using toliet, bedpan, or urinal?: Total Help from another person bathing (including washing, rinsing, drying)?: Total Help from another person to put on and taking off regular upper body clothing?: Total Help from another person to put on and taking off regular lower body clothing?: Total 6 Click Score: 6   End of Session Nurse Communication:  (did not really wake up any more in sitting than she did in supine; did move legs on command but needed help to initatiate arm movements)  Activity Tolerance: Patient limited by fatigue;Patient limited by lethargy Patient left: in bed;with call bell/phone within reach;with bed alarm set  OT Visit Diagnosis: Unsteadiness on feet (R26.81);Other abnormalities of gait and mobility (R26.89);Muscle weakness (generalized) (M62.81);Cognitive communication deficit (R41.841)                Time: 2440-1027 OT Time Calculation (min): 24 min Charges:  OT General Charges $OT Visit: 1 Procedure OT Evaluation $OT Eval Moderate Complexity: 1 Procedure OT Treatments $Self Care/Home Management : 8-22 mins [place] [address], OTR/L [phone] 05/07/2017

## 2017-05-07 NOTE — Progress Notes (Signed)
Pharmacy Antibiotic Note  Kelly Wolfe is a 83 y.o. female admitted on 05/04/2017 with sepsis.  Pharmacy has been consulted for vancomycin and cefepime dosing.  Plan: - Vancomycin 1g load now, followed by vancomycin 500mg  q24h - Cefepime 1g q24h - Follow renal function, cultures results, and LOT plans - Vanc trough at steady state  Height: 5' (152.4 cm) Weight: 122 lb 5.7 oz (55.5 kg) IBW/kg (Calculated) : 45.5  Temp (24hrs), Avg:99.1 F (37.3 C), Min:97.7 F (36.5 C), Max:103.3 F (39.6 C)   Recent Labs Lab 05/01/2017 1753 04/21/2017 2320 05/06/17 0147 05/06/17 0506 05/07/17 0354 05/07/17 0706  WBC 1.5*  --   --  0.9* 0.5*  --   CREATININE 1.70*  --   --  1.35* 0.96  --   LATICACIDVEN  --  1.7 1.4  --   --  1.6    Estimated Creatinine Clearance: 34.1 mL/min (by C-G formula based on SCr of 0.96 mg/dL).    Allergies  Allergen Reactions  . Lisinopril Cough    Went away with DC (?)  . Prednisone Other (See Comments)    Nausea, shaky, unsteady, decreased appetite    Antimicrobials this admission: 6/22 Levaquin*1 6/23 Vanc >  6/23 Cefepime >   Dose adjustments this admission:   Microbiology results: 6/22 BCx >   Thank you for allowing pharmacy to be a part of this patient's care.  Melburn Popper 05/07/2017 8:36 AM

## 2017-05-07 NOTE — Progress Notes (Signed)
Replaced Pure wick female external catheter.

## 2017-05-07 NOTE — Progress Notes (Signed)
Pt received post Rapid Response from 5W21 to 2M08 at 0645 with report received from Marlene Lard, RN.  Pt requiring 250cc fluid bolus and Levophed infusion upon arrival.  Bedside handoff communication to Conley Simmonds., RN with Dr. Posey Pronto in attendance.  Will continue to monitor.

## 2017-05-07 NOTE — Significant Event (Signed)
Rapid Response Event Note  Overview: Time Called: 7579 Arrival Time: 0541 Event Type: Neurologic, Respiratory  Initial Focused Assessment: Decrease LOC and dyspnea   Interventions: NRM O2  Plan of Care (if not transferred):  Event Summary: Called to assist with care of patient with reported decrease in LOC and dyspnea. Patient had mild labored breathing. Heart rate 120's. Lungs with scattered wheezes. Skin is warm and dry. Patient did arouse to a loud voice but quickly went back to sleep. O2 sats had dropped to 88% on [place]. Patient was placed on a venti mask. O2 sats increased to 96%. An ABG and PCXR was ordered per protocol. The oncall provider was made aware of patient changes.        Kelly Wolfe

## 2017-05-08 ENCOUNTER — Inpatient Hospital Stay (HOSPITAL_COMMUNITY): Payer: Medicare HMO

## 2017-05-08 DIAGNOSIS — Z7189 Other specified counseling: Secondary | ICD-10-CM

## 2017-05-08 DIAGNOSIS — Z515 Encounter for palliative care: Secondary | ICD-10-CM

## 2017-05-08 LAB — CBC WITH DIFFERENTIAL/PLATELET
Basophils Absolute: 0.1 10*3/uL (ref 0.0–0.1)
Basophils Relative: 1 %
EOS PCT: 0 %
Eosinophils Absolute: 0 10*3/uL (ref 0.0–0.7)
HCT: 41 % (ref 36.0–46.0)
Hemoglobin: 14 g/dL (ref 12.0–15.0)
LYMPHS PCT: 6 %
Lymphs Abs: 0.6 10*3/uL — ABNORMAL LOW (ref 0.7–4.0)
MCH: 35.4 pg — ABNORMAL HIGH (ref 26.0–34.0)
MCHC: 34.1 g/dL (ref 30.0–36.0)
MCV: 103.5 fL — ABNORMAL HIGH (ref 78.0–100.0)
MONO ABS: 0.1 10*3/uL (ref 0.1–1.0)
Monocytes Relative: 1 %
NEUTROS PCT: 92 %
Neutro Abs: 9.5 10*3/uL — ABNORMAL HIGH (ref 1.7–7.7)
PLATELETS: 95 10*3/uL — AB (ref 150–400)
RBC: 3.96 MIL/uL (ref 3.87–5.11)
RDW: 13.5 % (ref 11.5–15.5)
WBC MORPHOLOGY: INCREASED
WBC: 10.3 10*3/uL (ref 4.0–10.5)

## 2017-05-08 LAB — HEMOGLOBIN A1C
HEMOGLOBIN A1C: 5.5 % (ref 4.8–5.6)
MEAN PLASMA GLUCOSE: 111 mg/dL

## 2017-05-08 LAB — GLUCOSE, CAPILLARY
GLUCOSE-CAPILLARY: 133 mg/dL — AB (ref 65–99)
GLUCOSE-CAPILLARY: 132 mg/dL — AB (ref 65–99)
GLUCOSE-CAPILLARY: 151 mg/dL — AB (ref 65–99)
Glucose-Capillary: 114 mg/dL — ABNORMAL HIGH (ref 65–99)
Glucose-Capillary: 156 mg/dL — ABNORMAL HIGH (ref 65–99)

## 2017-05-08 LAB — COMPREHENSIVE METABOLIC PANEL
ALBUMIN: 2.4 g/dL — AB (ref 3.5–5.0)
ALK PHOS: 218 U/L — AB (ref 38–126)
ALT: 69 U/L — AB (ref 14–54)
ANION GAP: 10 (ref 5–15)
AST: 574 U/L — ABNORMAL HIGH (ref 15–41)
BUN: 36 mg/dL — ABNORMAL HIGH (ref 6–20)
CALCIUM: 7.5 mg/dL — AB (ref 8.9–10.3)
CO2: 15 mmol/L — AB (ref 22–32)
CREATININE: 1.43 mg/dL — AB (ref 0.44–1.00)
Chloride: 115 mmol/L — ABNORMAL HIGH (ref 101–111)
GFR calc Af Amer: 38 mL/min — ABNORMAL LOW (ref >60)
GFR calc non Af Amer: 33 mL/min — ABNORMAL LOW (ref >60)
GLUCOSE: 142 mg/dL — AB (ref 65–99)
Potassium: 3.9 mmol/L (ref 3.5–5.1)
SODIUM: 140 mmol/L (ref 135–145)
Total Bilirubin: 1 mg/dL (ref 0.3–1.2)
Total Protein: 4.2 g/dL — ABNORMAL LOW (ref 6.5–8.1)

## 2017-05-08 LAB — PROTIME-INR
INR: 1.1
PROTHROMBIN TIME: 14.3 s (ref 11.4–15.2)

## 2017-05-08 LAB — MAGNESIUM: Magnesium: 2 mg/dL (ref 1.7–2.4)

## 2017-05-08 MED ORDER — FUROSEMIDE 10 MG/ML IJ SOLN
40.0000 mg | Freq: Two times a day (BID) | INTRAMUSCULAR | Status: DC
  Filled 2017-05-08 (×2): qty 4

## 2017-05-08 MED ORDER — METHYLPREDNISOLONE SODIUM SUCC 125 MG IJ SOLR
60.0000 mg | Freq: Four times a day (QID) | INTRAMUSCULAR | Status: DC
  Administered 2017-05-08: 60 mg via INTRAVENOUS
  Administered 2017-05-08: 125 mg via INTRAVENOUS
  Administered 2017-05-08 – 2017-05-09 (×2): 60 mg via INTRAVENOUS
  Filled 2017-05-08: qty 2
  Filled 2017-05-08: qty 0.96
  Filled 2017-05-08: qty 2
  Filled 2017-05-08 (×4): qty 0.96

## 2017-05-08 MED ORDER — ASPIRIN 300 MG RE SUPP
300.0000 mg | Freq: Every day | RECTAL | Status: DC
  Administered 2017-05-08 – 2017-05-10 (×3): 300 mg via RECTAL
  Filled 2017-05-08 (×4): qty 1

## 2017-05-08 MED ORDER — IPRATROPIUM BROMIDE 0.02 % IN SOLN
0.5000 mg | Freq: Four times a day (QID) | RESPIRATORY_TRACT | Status: DC
  Administered 2017-05-08 – 2017-05-11 (×10): 0.5 mg via RESPIRATORY_TRACT
  Filled 2017-05-08 (×13): qty 2.5

## 2017-05-08 NOTE — Consult Note (Signed)
Consultation Note Date: 05/08/2017   Patient Name: Kelly Wolfe  DOB: 11-04-1931  MRN: [id number]  Age / Sex: 83 y.o., female  PCP: Leighton Ruff, MD Referring Physician: Lavina Hamman, MD  Reason for Consultation: Establishing goals of care and Psychosocial/spiritual support  HPI/Patient Profile: 83 y.o. female   admitted on 04/20/2017 with  PMH of hypertension, pancytopenia, arthritis; presented with complaint of confusion, was found to have community-acquired pneumonia, suspected CVA, worsening pancytopenia.  Then on 05-08-17  the patient become unresponsive, developed severe hypoxia and need to be transferred to step down unit and was placed on BiPAP. In morning the patient was initially unresponsive but later on was able to open up her eyes and track  Head CTA positive for  acute LEFT basal ganglia nonhemorrhagic infarct. Otherwise negative  Currently follows simple commands but her alertness waxes and wanes.    Family face advanced directive decisions and anticipatory care needs.    Clinical Assessment and Goals of Care:   This NP Wadie Lessen reviewed medical records, received report from team, assessed the patient and then meet at the patient's bedside along with her three children  to discuss diagnosis, prognosis, GOC, EOL wishes disposition and options.  A detailed discussion was had today regarding advanced directives.  Concepts specific to code status, artifical feeding and hydration, continued IV antibiotics and rehospitalization was had.  The difference between a aggressive medical intervention path  and a palliative comfort care path for this patient at this time was had.  Values and goals of care important to patient and family were attempted to be elicited.  Patient has clearly documented paperwork requesting a natural death in event of poor prognosis for recovery.   MOST form  introduced.  Hard choices left for review  Concept of Hospice and Palliative Care were discussed  Natural trajectory and expectations at EOL were discussed.  Questions and concerns addressed.   Family encouraged to call with questions or concerns.  PMT will continue to support holistically.  HCPOA/ Margaret/ daughter     SUMMARY OF RECOMMENDATIONS    Continue to treat the treatable.  Family is hopeful for improvement.  They are realistic and want to honor the patient's wishes for a natural death, however   family needs a little more time before consideration of any de-escalation of care.  Palliative will re-meet with them tomorrow afternoon at 1:00  Code Status/Advance Care Planning:  Limited code- may use BiPap   Palliative Prophylaxis:   Aspiration, Bowel Regimen, Delirium Protocol and Oral Care  Additional Recommendations (Limitations, Scope, Preferences):  Full Scope Treatment  Psycho-social/Spiritual:   Desire for further Chaplaincy support:no  Additional Recommendations: Education on Hospice  Prognosis:   Unable to determine- will be dependant on decisions for life prolonging measures.    Discharge Planning: To Be Determined      Primary Diagnoses: Present on Admission: . Altered mental status . Expressive aphasia . Neutropenia ([place]) . Syncope . AKI (acute kidney injury) ([place]) . Dehydration . HTN (hypertension) .  CAP (community acquired pneumonia)   I have reviewed the medical record, interviewed the patient and family, and examined the patient. The following aspects are pertinent.  Past Medical History:  Diagnosis Date  . Allergic rhinitis   . Hypertension   . Neutropenia    with elevated MCV no dx saw heme in the past neg rheum zieminski  . Osteoarthritis   . Osteopenia    mild dexa 2007 -0.7 hip sp nl  . Spinal stenosis    Social History   Social History  . Marital status: Married    Spouse name: widowed   . Number of children: N/A  .  Years of education: N/A   Occupational History  .  Retired   Social History Main Topics  . Smoking status: Never Smoker  . Smokeless tobacco: Never Used  . Alcohol use 4.2 oz/week    7 Glasses of wine per week     Comment: cocktail in afternoon, daily   . Drug use: No  . Sexual activity: Not Currently   Other Topics Concern  . None   Social History Narrative   Retired   Widowed 7/04    [place] of 1   Regular exercise- yes   No family history on file. Scheduled Meds: . acetaminophen  650 mg Rectal Once  . aspirin  300 mg Rectal Daily  . furosemide  40 mg Intravenous BID  . ipratropium  0.5 mg Nebulization Q6H  . levalbuterol  0.63 mg Nebulization Q6H  . methylPREDNISolone (SOLU-MEDROL) injection  60 mg Intravenous Q6H  . multivitamin with minerals  1 tablet Oral Daily  . sodium chloride flush  3 mL Intravenous Q12H  . sodium chloride flush  3 mL Intravenous Q12H  . thiamine  100 mg Oral Daily   Or  . thiamine  100 mg Intravenous Daily   Continuous Infusions: . sodium chloride 250 mL (05/07/17 2118)  . ceFEPime (MAXIPIME) IV Stopped (05/08/17 0906)  . sodium chloride    . vancomycin     PRN Meds:.sodium chloride, acetaminophen, sodium chloride flush Medications Prior to Admission:  Prior to Admission medications   Medication Sig Start Date End Date Taking? Authorizing Provider  amLODipine (NORVASC) 2.5 MG tablet TAKE 1 TABLET BY MOUTH DAILY Patient taking differently: Take 2.5 mg by mouth once a day 08/28/15  Yes Panosh, Standley Brooking, MD  cetirizine (ZYRTEC) 10 MG tablet Take 10 mg by mouth daily as needed for allergies.    Yes [provider]  dorzolamide-timolol (COSOPT) 22.3-6.8 MG/ML ophthalmic solution Place 1 drop into both eyes 2 (two) times daily.   Yes [provider]  fluticasone (FLONASE) 50 MCG/ACT nasal spray USE 2 SPRAYS IN THE NOSE DAILY Patient taking differently: Instill 2 sprays into each nostril once a day as needed for seasonal allergies  04/16/14  Yes Panosh, Standley Brooking, MD  latanoprost (XALATAN) 0.005 % ophthalmic solution Place 1 drop into both eyes at bedtime.   Yes [provider]  Multiple Vitamins-Minerals (ONE-A-DAY WOMENS 50+ ADVANTAGE) TABS Take 1 tablet by mouth daily.   Yes [provider]  calcium-vitamin D (OSCAL WITH D) 500-200 MG-UNIT per tablet Take 1 tablet by mouth daily.      [provider]  traZODone (DESYREL) 50 MG tablet TAKE 1/2 - 1 TABLETS BY MOUTH AT [facility] NEEDED FOR SLEEP 01/15/15   Panosh, Standley Brooking, MD  Vitamins-Lipotropics (LIPOFLAVONOID PO) Take 1 tablet by mouth daily.    [provider]   Allergies  Allergen Reactions  . Lisinopril Cough    Went away with DC (?)  . Prednisone Other (See Comments)    Nausea, shaky, unsteady, decreased appetite   Review of Systems  Unable to perform ROS: Mental status change    Physical Exam  Constitutional: She appears lethargic. She appears ill. Face mask in place.  Cardiovascular: Tachycardia present.   Pulmonary/Chest: Tachypnea noted.  Neurological: She appears lethargic.  Skin: Skin is warm and dry.    Vital Signs: BP 126/76   Pulse (!) 105   Temp 98.4 F (36.9 C) (Oral)   Resp (!) 31   Ht 5' (1.524 m)   Wt 55.5 kg (122 lb 5.7 oz)   SpO2 95%   BMI 23.90 kg/m  Pain Assessment: 0-10   Pain Score: Asleep   SpO2: SpO2: 95 % O2 Device:SpO2: 95 % O2 Flow Rate: .O2 Flow Rate (L/min): 15 L/min  IO: Intake/output summary:  Intake/Output Summary (Last 24 hours) at 05/08/17 0934 Last data filed at 05/08/17 0600  Gross per 24 hour  Intake             1035 ml  Output              300 ml  Net              735 ml    LBM: Last BM Date: 05/06/17 Baseline Weight: Weight: 50.8 kg (111 lb 14.4 oz) Most recent weight: Weight: 55.5 kg (122 lb 5.7 oz)     Palliative Assessment/Data: 20 % at best   Discussed with Dr Posey Pronto  Time In: 1130 Time Out: 1245 Time Total: 75 min Greater than 50%  of this time was  spent counseling and coordinating care related to the above assessment and plan.  Signed by: Wadie Lessen, NP   Please contact Palliative Medicine Team phone at [phone] for questions and concerns.  For individual provider: See Shea Evans

## 2017-05-08 NOTE — Progress Notes (Signed)
[facility] Progress Note  Patient: Kelly Wolfe WUJ:[id number]   PCP: Kelly Ruff, MD DOB: 05-Jul-1931   DOA: 04/15/2017   DOS: 05/08/2017   Date of Service: the patient was seen and examined on 05/08/2017  Subjective: It is documented that the patient was given IV Haldol last night for agitation although I do not see any medication given on MAR. Patient was comfortable yesterday but becomes more lethargic this morning. Occasionally opens of her eyes, following command. Not conversing.  Brief hospital course: Pt. with PMH of hypertension, pancytopenia, arthritis; admitted on 04/19/2017, presented with complaint of confusion, was found to have community-acquired pneumonia, suspected CVA, worsening bicytopenia. Currently further plan is continue current care and management in the step down unit.  Assessment and Plan: 1. Acute hypoxic respiratory failure. Community-acquired pneumonia. Currently requiring nonrebreather to maintain adequate saturation. X-ray chest this morning shows small pleural effusion as well as some vascular congestion. initially had fever continues to have leukopenia. Blood cultures so far negative. We will broaden the antibiotic coverage, vancomycin and cefepime from the Levaquin. Duo nebs as well as pulmonary toilet. Patient in significant respiratory distress with respiratory rate in 30s and 40s, echocardiogram. Discussed with therapist, will attempt BiPAP for her work of breathing. For initiation the BiPAP the respiratory rate went down to 20s as well as heart rate went down to 90s. Breaks from BiPAP when necessary and at least every 4 hours. Adding IV Solu-Medrol 60 mg every 4 hours. Continuing IV Lasix as the blood pressure allows.  2. Acute encephalopathy. Overnight patient had worsening of her mental status likely from IV Ativan. Patient received 1 mg Ativan at 10:30 and 1:15 night between 05/06/2017 and 05/07/2017. Following this the patient will  come unresponsive. At present we will continue supportive management. . Continue to hold sedative to medications.. Patient was given Haldol reportedly per documentation, I would avoid giving any psychotropic medication unless indicated to this patient and be aware of the dose as the patient appears to be sensitive to routine doses.  3. Left basal ganglia CVA.  Delirium in the setting of sepsis. Patient presented with confusion in the hospital. This was thought secondary to delirium. CT head unremarkable.  Full stroke workup was completed. Echo shows EF of 25%. Carotid Doppler shows no significant stenosis. Hemoglobin A1c 5.5. MRI brain shows left basal ganglia infarct. As per my discussion with neurology as the patient has evidence of involvement of caudate nucleus which can cause confusion in the long-term which might be the cause of patient's presentation to the hospital. They agreed that this size of the stroke do not represent why the patient has global lethargy and minimal responsiveness. Platelet count has improved to 19 today, we will start the patient on 325 mg aspirin suppository. Unable to use Lipitor at present since the patient remains nothing by mouth. Speech therapy consult, PT OT consulted. Neurology currently recommends no further workup, as well as no indication for acute inpatient consult.  4. 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 Follows up with hematology as an outpatient. Severe neutropenia right now with worsening in the setting of infection. I discussed with on-call hematology, patient was given one dose of Granix, today her WAC is 10.5 as well as platelets are 90. Transfuse as needed for platelets less than 10 or any active bleeding. Getting CT head to rule out any acute intracranial bleeding.  5. Diarrhea. Dehydration with acute kidney injury. Patient had diarrhea at home, no further bowel movement here after yesterday's 2 loose  BM. Monitor. Acute kidney injury is  significantly better after aggressive IV hydration.  6. Takatsubo cardiomyopathy. Patient presented with pneumonia, diarrhea, acute kidney injury, acute encephalopathy. Overnight developed worsening hypoxia requiring BiPAP. Echo performed which shows EF of 25% with diffuse hypokinesis other than inferior wall. Discussed with cardiology, this is consistent with Takatsubo cardiomyopathy and recommended supportive management. Unfortunately due to hypotension cannot tolerate beta blocker. Patient did receive multiple episodes bolus and may have mild volume overload, currently we'll stop IV fluid and use albumin Support Pressures. Treating Underlying Infection As Well As Respiratory Distress.  7. Hypotension. In the setting of sepsis, received IV fluid bolus. Currently relatively stable. If the blood pressure improves further would actually like to use IV Lasix to maintain the patient on driver's side for respiratory distress. Also in the setting of Takatsubo cardiomyopathy. Monitor.  8. Goals of care discussion. Patient has a living will that mentions"desire for natural death". It also clearly mentions regarding avoiding artificial feeding, CPR, respirator. Discussed with patient's daughters, Kelly Wolfe And Kelly Wolfe, changing CODE STATUS to partial. Palliative care consulted. We will continue to monitor for the next 24 hours for improvement in patient's condition. Patient's prognosis remains guarded in the setting of acute CVA, chronic bicytopenia, cardiomyopathy, respiratory failure, acute kidney injury as well as liver damage.  Diet: Currently nothing by mouth until fully awake. DVT Prophylaxis: mechanical compression device  Advance goals of care discussion: partial, based on the living will and discussion with family. Continue BiPAP.  Family Communication: family was present at bedside, at the time of interview. The pt provided permission to discuss medical plan with the family. Opportunity was  given to ask question and all questions were answered satisfactorily.   Disposition:  Discharge to be determined.  Consultants: Phone consultation with CCM, hematology, neurology. Palliative care Procedures: Echocardiogram, BIPAP  Antibiotics: Anti-infectives    Start     Dose/Rate Route Frequency Ordered Stop   05/08/17 1000  levofloxacin (LEVAQUIN) IVPB 500 mg  Status:  Discontinued    Comments:  Levaquin 500 mg IV q48h for CrCL < 30 mL/min   500 mg 100 mL/hr over 60 Minutes Intravenous Every 48 hours 05/06/17 0612 05/07/17 0751   05/08/17 0900  vancomycin (VANCOCIN) 500 mg in sodium chloride 0.9 % 100 mL IVPB     500 mg 100 mL/hr over 60 Minutes Intravenous Every 24 hours 05/07/17 0836     05/07/17 0900  vancomycin (VANCOCIN) IVPB 1000 mg/200 mL premix     1,000 mg 200 mL/hr over 60 Minutes Intravenous  Once 05/07/17 0836 05/07/17 1119   05/07/17 0900  ceFEPIme (MAXIPIME) 1 g in dextrose 5 % 50 mL IVPB     1 g 100 mL/hr over 30 Minutes Intravenous Every 24 hours 05/07/17 0836     05/06/17 0630  levofloxacin (LEVAQUIN) IVPB 750 mg     750 mg 100 mL/hr over 90 Minutes Intravenous  Once 05/06/17 0612 05/06/17 0824       Objective: Physical Exam: Vitals:   05/08/17 1400 05/08/17 1430 05/08/17 1500 05/08/17 1618  BP: 90/66  (!) 88/60   Pulse: (!) 106 (!) 104 100   Resp: (!) 36 (!) 36 (!) 34   Temp:    97.5 F (36.4 C)  TempSrc:    Axillary  SpO2: 97% 97% 100%   Weight:      Height:        Intake/Output Summary (Last 24 hours) at 05/08/17 1707 Last data filed at 05/08/17 1700  Gross  per 24 hour  Intake              290 ml  Output              650 ml  Net             -360 ml   Filed Weights   04/21/2017 2331 05/07/17 0500  Weight: 50.8 kg (111 lb 14.4 oz) 55.5 kg (122 lb 5.7 oz)   General: Drowsy, lethargic, Able to follow command, occasionally opening her eyes and track. Appear in severe distress, affect difficult to assess Eyes: PERRL, Conjunctiva normal ENT:  Oral Mucosa clear moist Neck: difficult to assess JVD, no Abnormal Mass Or lumps Cardiovascular: S1 and S2 Present, no Murmur, Peripheral Pulses Present Respiratory: increased respiratory effort, Bilateral Air entry equal and Decreased, positive use of accessory muscle, bilateral Crackles, expiratory wheezes Abdomen: Bowel Sound present, Soft and no tenderness, no hernia Skin: no redness, no Rash, no induration Extremities: no Pedal edema, no calf tenderness Neurologic: Withdraws to painful stimuli, more than that is difficult to assess due to encephalopathy  Data Reviewed: CBC:  Recent Labs Lab 04/22/2017 1753 05/06/17 0506 05/06/17 0645 05/07/17 0354 05/08/17 0513  WBC 1.5* 0.9*  --  0.5* 10.3  NEUTROABS 1.3*  --  1.0* 0.3* 9.5*  HGB 11.1* 11.7*  --  12.8 14.0  HCT 33.2* 34.4*  --  37.1 41.0  MCV 107.1* 105.5*  --  103.3* 103.5*  PLT 41* 38*  --  50* 95*   Basic Metabolic Panel:  Recent Labs Lab 04/15/2017 1753 05/06/17 0506 05/07/17 0354 05/08/17 0407  NA 136 139 139 140  K 3.9 3.7 3.7 3.9  CL 106 110 111 115*  CO2 22 20* 18* 15*  GLUCOSE 124* 106* 117* 142*  BUN 36* 33* 26* 36*  CREATININE 1.70* 1.35* 0.96 1.43*  CALCIUM 7.7* 7.8* 7.7* 7.5*  MG  --   --   --  2.0    Liver Function Tests:  Recent Labs Lab 05/02/2017 1753 05/07/17 0354 05/08/17 0407  AST 228* 446* 574*  ALT 44 57* 69*  ALKPHOS 90 159* 218*  BILITOT 0.9 1.0 1.0  PROT 4.6* 4.5* 4.2*  ALBUMIN 2.9* 2.5* 2.4*   No results for input(s): LIPASE, AMYLASE in the last 168 hours.  Recent Labs Lab 05/06/2017 2103  AMMONIA 17   Coagulation Profile:  Recent Labs Lab 05/08/17 0407  INR 1.10   Cardiac Enzymes:  Recent Labs Lab 05/02/2017 2204 04/16/2017 2320 05/06/17 0506 05/06/17 1333  TROPONINI <0.03 0.03* 0.06* 0.06*   BNP (last 3 results) No results for input(s): PROBNP in the last 8760 hours. CBG:  Recent Labs Lab 05/08/17 0002 05/08/17 0357 05/08/17 0814 05/08/17 1156  05/08/17 1620  GLUCAP 114* 133* 132* 151* 156*   Studies: Ct Head Wo Contrast  Result Date: 05/07/2017 CLINICAL DATA:  Patient was found unresponsive in respiratory distress, patient non-verbal for history EXAM: CT HEAD WITHOUT CONTRAST TECHNIQUE: Contiguous axial images were obtained from the base of the skull through the vertex without intravenous contrast. COMPARISON:  04/25/2017 FINDINGS: Brain: No evidence of acute infarction, hemorrhage, hydrocephalus, extra-axial collection or mass lesion/mass effect. There is age appropriate ventricular and sulcal enlargement. Vascular: No hyperdense vessel or unexpected calcification. Skull: Normal. Negative for fracture or focal lesion. Sinuses/Orbits: Globes and orbits are unremarkable. Visualized sinuses and mastoid air cells are clear. Other: None. IMPRESSION: 1. No acute intracranial abnormalities. Stable appearance from the prior study. Electronically Signed  By: Lajean Manes M.D.   On: 05/07/2017 18:44   Mr Brain Wo Contrast  Result Date: 05/07/2017 CLINICAL DATA:  Confusion. History of hypertension, syncope, hyperlipidemia. EXAM: MRI HEAD WITHOUT CONTRAST TECHNIQUE: Multiplanar, multiecho pulse sequences of the brain and surrounding structures were obtained without intravenous contrast. COMPARISON:  CT HEAD May 07, 2017 1809 hours FINDINGS: Multiple sequences are moderately or severely motion degraded. BRAIN: Faint reduced diffusion LEFT basal ganglia with low ADC value. LEFT inferior basal ganglia perivascular space. No susceptibility artifact to suggest hemorrhage though motion decreases sensitivity. The ventricles and sulci are normal for patient's age. Minimal supratentorial white matter T2 hyperintensities compatible with chronic small vessel ischemic disease, less than expected for age. No suspicious parenchymal signal, masses or mass effect. No abnormal extra-axial fluid collections. VASCULAR: Normal major intracranial vascular flow voids  present at skull base. SKULL AND UPPER CERVICAL SPINE: No abnormal sellar expansion. No suspicious calvarial bone marrow signal. Craniocervical junction maintained. SINUSES/ORBITS: The mastoid air-cells and included paranasal sinuses are well-aerated. The included ocular globes and orbital contents are non-suspicious. Status post bilateral ocular lens implants. OTHER: None. IMPRESSION: Moderate to severely degraded examination. Acute LEFT basal ganglia nonhemorrhagic infarct. Otherwise negative MRI of the head for age. Electronically Signed   By: Elon Alas M.D.   On: 05/07/2017 23:39   Dg Chest Port 1 View  Result Date: 05/08/2017 CLINICAL DATA:  Shortness of breath.  Tachypnea. EXAM: PORTABLE CHEST 1 VIEW COMPARISON:  05/07/2017 FINDINGS: Cardiac silhouette is grossly unchanged in size allowing for new obscuration of the right heart border. Aortic atherosclerosis is noted. There is pulmonary vascular congestion with increasing hazy and patchy opacities in the perihilar and basilar regions bilaterally. Increasing veiling opacities in both lung bases likely reflect enlarging pleural effusions. No pneumothorax is identified. No acute osseous abnormality is seen. IMPRESSION: Increasing lung opacities compatible with edema and pleural effusions. Electronically Signed   By: Logan Bores M.D.   On: 05/08/2017 09:50    Scheduled Meds: . acetaminophen  650 mg Rectal Once  . aspirin  300 mg Rectal Daily  . furosemide  40 mg Intravenous BID  . ipratropium  0.5 mg Nebulization Q6H  . levalbuterol  0.63 mg Nebulization Q6H  . methylPREDNISolone (SOLU-MEDROL) injection  60 mg Intravenous Q6H  . multivitamin with minerals  1 tablet Oral Daily  . sodium chloride flush  3 mL Intravenous Q12H  . sodium chloride flush  3 mL Intravenous Q12H  . thiamine  100 mg Oral Daily   Or  . thiamine  100 mg Intravenous Daily   Continuous Infusions: . sodium chloride 250 mL (05/07/17 2118)  . ceFEPime (MAXIPIME) IV  Stopped (05/08/17 0906)  . sodium chloride    . vancomycin Stopped (05/08/17 1024)   PRN Meds: sodium chloride, acetaminophen, sodium chloride flush  CRITICAL CARE Performed by: Berle Mull   Total critical care time: 35 minutes  Critical care time was exclusive of separately billable procedures and treating other patients.  Critical care was necessary to treat or prevent imminent or life-threatening deterioration.  Critical care was time spent personally by me on the following activities: development of treatment plan with patient and/or surrogate as well as nursing, discussions with consultants, evaluation of patient's response to treatment, examination of patient, obtaining history from patient or surrogate, ordering and performing treatments and interventions, ordering and review of laboratory studies, ordering and review of radiographic studies, pulse oximetry and re-evaluation of patient's condition.   Author: Berle Mull, MD [facility] Pager: [phone] 05/08/2017 5:07 PM  If 7PM-7AM, please contact night-coverage at [email or web address], password [facility]

## 2017-05-08 NOTE — Progress Notes (Signed)
Clarification and error in documentation...haldol was not administered to this pt...05/08/17 0700 progress note documenting haldol administration to this pt will be documented on correct pt.

## 2017-05-08 NOTE — Progress Notes (Signed)
MRI result called to [facility]

## 2017-05-08 NOTE — Progress Notes (Signed)
EEG Completed; Results Pending  

## 2017-05-08 NOTE — Progress Notes (Signed)
Pt irritable agitate, somewhat uncooperative but consolable. Haldol 5mg  iv ordered, 2.5mg  of 5 mg ivp given. Haldol 2.5 mg wasted in sink, witnessed by nurse courtney

## 2017-05-08 NOTE — Procedures (Signed)
History: 83 year old female with confusion  Sedation: None  Technique: This is a 21 channel routine scalp EEG performed at the bedside with bipolar and monopolar montages arranged in accordance to the international 10/20 system of electrode placement. One channel was dedicated to EKG recording.    Background: There is a posterior dominant rhythm of 7-8 Hz. In addition, even during maximal wakefulness there is mild irregular delta and theta activity. Sleep is briefly recorded with symmetrical appearing structures.  Photic stimulation: Physiologic driving is not performed  EEG Abnormalities: 1) generalized irregular slow activity 2) slow PDR  Clinical Interpretation: This EEG is consistent with a  generalized nonspecific cerebral dysfunction (encephalopathy). There was no seizure or seizure predisposition recorded on this study. Please note that a normal EEG does not preclude the possibility of epilepsy.   Roland Rack, MD [facility] [phone]  If 7pm- 7am, please page neurology on call as listed in [place].

## 2017-05-09 ENCOUNTER — Inpatient Hospital Stay (HOSPITAL_COMMUNITY): Payer: Medicare HMO

## 2017-05-09 DIAGNOSIS — R0602 Shortness of breath: Secondary | ICD-10-CM

## 2017-05-09 LAB — RENAL FUNCTION PANEL
Albumin: 2.3 g/dL — ABNORMAL LOW (ref 3.5–5.0)
Anion gap: 9 (ref 5–15)
BUN: 60 mg/dL — ABNORMAL HIGH (ref 6–20)
CHLORIDE: 121 mmol/L — AB (ref 101–111)
CO2: 16 mmol/L — ABNORMAL LOW (ref 22–32)
Calcium: 7.8 mg/dL — ABNORMAL LOW (ref 8.9–10.3)
Creatinine, Ser: 2.01 mg/dL — ABNORMAL HIGH (ref 0.44–1.00)
GFR, EST AFRICAN AMERICAN: 25 mL/min — AB (ref >60)
GFR, EST NON AFRICAN AMERICAN: 22 mL/min — AB (ref >60)
Glucose, Bld: 171 mg/dL — ABNORMAL HIGH (ref 65–99)
POTASSIUM: 3.6 mmol/L (ref 3.5–5.1)
Phosphorus: 3.3 mg/dL (ref 2.5–4.6)
Sodium: 146 mmol/L — ABNORMAL HIGH (ref 135–145)

## 2017-05-09 LAB — URINALYSIS, ROUTINE W REFLEX MICROSCOPIC
Bilirubin Urine: NEGATIVE
Glucose, UA: NEGATIVE mg/dL
Ketones, ur: NEGATIVE mg/dL
LEUKOCYTES UA: NEGATIVE
NITRITE: NEGATIVE
Protein, ur: 30 mg/dL — AB
SPECIFIC GRAVITY, URINE: 1.016 (ref 1.005–1.030)
pH: 5 (ref 5.0–8.0)

## 2017-05-09 LAB — SODIUM, URINE, RANDOM: SODIUM UR: 44 mmol/L

## 2017-05-09 LAB — CBC WITH DIFFERENTIAL/PLATELET
BASOS PCT: 2 %
Basophils Absolute: 0.3 10*3/uL — ABNORMAL HIGH (ref 0.0–0.1)
EOS PCT: 0 %
Eosinophils Absolute: 0 10*3/uL (ref 0.0–0.7)
HCT: 39.9 % (ref 36.0–46.0)
HEMOGLOBIN: 13.6 g/dL (ref 12.0–15.0)
LYMPHS ABS: 1.4 10*3/uL (ref 0.7–4.0)
LYMPHS PCT: 9 %
MCH: 35.2 pg — AB (ref 26.0–34.0)
MCHC: 34.1 g/dL (ref 30.0–36.0)
MCV: 103.4 fL — AB (ref 78.0–100.0)
MONOS PCT: 1 %
Monocytes Absolute: 0.2 10*3/uL (ref 0.1–1.0)
NEUTROS ABS: 13.2 10*3/uL — AB (ref 1.7–7.7)
Neutrophils Relative %: 88 %
Platelets: 110 10*3/uL — ABNORMAL LOW (ref 150–400)
RBC: 3.86 MIL/uL — ABNORMAL LOW (ref 3.87–5.11)
RDW: 13.6 % (ref 11.5–15.5)
WBC Morphology: INCREASED
WBC: 15.1 10*3/uL — ABNORMAL HIGH (ref 4.0–10.5)

## 2017-05-09 LAB — OSMOLALITY, URINE: OSMOLALITY UR: 539 mosm/kg (ref 300–900)

## 2017-05-09 LAB — PROTIME-INR
INR: 1.49
Prothrombin Time: 18.2 seconds — ABNORMAL HIGH (ref 11.4–15.2)

## 2017-05-09 LAB — COMPREHENSIVE METABOLIC PANEL
ALBUMIN: 2.2 g/dL — AB (ref 3.5–5.0)
ALK PHOS: 285 U/L — AB (ref 38–126)
ALT: 98 U/L — ABNORMAL HIGH (ref 14–54)
ANION GAP: 11 (ref 5–15)
AST: 690 U/L — ABNORMAL HIGH (ref 15–41)
BUN: 51 mg/dL — ABNORMAL HIGH (ref 6–20)
CALCIUM: 7.6 mg/dL — AB (ref 8.9–10.3)
CO2: 17 mmol/L — ABNORMAL LOW (ref 22–32)
Chloride: 117 mmol/L — ABNORMAL HIGH (ref 101–111)
Creatinine, Ser: 1.99 mg/dL — ABNORMAL HIGH (ref 0.44–1.00)
GFR calc Af Amer: 25 mL/min — ABNORMAL LOW (ref >60)
GFR, EST NON AFRICAN AMERICAN: 22 mL/min — AB (ref >60)
Glucose, Bld: 173 mg/dL — ABNORMAL HIGH (ref 65–99)
POTASSIUM: 3.7 mmol/L (ref 3.5–5.1)
Sodium: 145 mmol/L (ref 135–145)
TOTAL PROTEIN: 4.2 g/dL — AB (ref 6.5–8.1)
Total Bilirubin: 1 mg/dL (ref 0.3–1.2)

## 2017-05-09 LAB — CREATININE, URINE, RANDOM: Creatinine, Urine: 73.82 mg/dL

## 2017-05-09 LAB — PATHOLOGIST SMEAR REVIEW

## 2017-05-09 LAB — OSMOLALITY: OSMOLALITY: 326 mosm/kg — AB (ref 275–295)

## 2017-05-09 LAB — GLUCOSE, CAPILLARY: Glucose-Capillary: 159 mg/dL — ABNORMAL HIGH (ref 65–99)

## 2017-05-09 LAB — CK: CK TOTAL: 869 U/L — AB (ref 38–234)

## 2017-05-09 LAB — AMMONIA: AMMONIA: 41 umol/L — AB (ref 9–35)

## 2017-05-09 MED ORDER — SODIUM CHLORIDE 0.9 % IV SOLN
INTRAVENOUS | Status: DC
  Administered 2017-05-10 – 2017-05-11 (×3): via INTRAVENOUS

## 2017-05-09 MED ORDER — DORZOLAMIDE HCL-TIMOLOL MAL 2-0.5 % OP SOLN
1.0000 [drp] | Freq: Two times a day (BID) | OPHTHALMIC | Status: DC
  Administered 2017-05-09 – 2017-05-10 (×4): 1 [drp] via OPHTHALMIC
  Filled 2017-05-09: qty 10

## 2017-05-09 MED ORDER — CARVEDILOL 3.125 MG PO TABS
3.1250 mg | ORAL_TABLET | Freq: Two times a day (BID) | ORAL | Status: DC
  Administered 2017-05-09: 3.125 mg via ORAL
  Filled 2017-05-09 (×2): qty 1

## 2017-05-09 MED ORDER — FUROSEMIDE 10 MG/ML IJ SOLN: 40.0000 mg | Freq: Every day | INTRAMUSCULAR | Status: DC

## 2017-05-09 MED ORDER — LATANOPROST 0.005 % OP SOLN
1.0000 [drp] | Freq: Every day | OPHTHALMIC | Status: DC
  Administered 2017-05-09 – 2017-05-10 (×2): 1 [drp] via OPHTHALMIC
  Filled 2017-05-09: qty 2.5

## 2017-05-09 MED ORDER — SODIUM CHLORIDE 0.9 % IV SOLN
3.0000 g | Freq: Two times a day (BID) | INTRAVENOUS | Status: DC
  Administered 2017-05-09 – 2017-05-11 (×5): 3 g via INTRAVENOUS
  Filled 2017-05-09 (×5): qty 3

## 2017-05-09 MED ORDER — ORAL CARE MOUTH RINSE
15.0000 mL | Freq: Two times a day (BID) | OROMUCOSAL | Status: DC
  Administered 2017-05-09 – 2017-05-11 (×4): 15 mL via OROMUCOSAL

## 2017-05-09 MED ORDER — PIPERACILLIN-TAZOBACTAM 3.375 G IVPB
3.3750 g | Freq: Three times a day (TID) | INTRAVENOUS | Status: DC
Start: 2017-05-09 — End: 2017-05-09
  Filled 2017-05-09 (×2): qty 50

## 2017-05-09 MED ORDER — POLYETHYLENE GLYCOL 3350 17 G PO PACK
17.0000 g | PACK | Freq: Every day | ORAL | Status: DC
  Filled 2017-05-09 (×2): qty 1

## 2017-05-09 MED ORDER — METHYLPREDNISOLONE SODIUM SUCC 125 MG IJ SOLR
60.0000 mg | Freq: Two times a day (BID) | INTRAMUSCULAR | Status: DC
  Administered 2017-05-09 – 2017-05-11 (×4): 60 mg via INTRAVENOUS
  Filled 2017-05-09: qty 0.96
  Filled 2017-05-09 (×2): qty 2
  Filled 2017-05-09: qty 0.96

## 2017-05-09 NOTE — Progress Notes (Addendum)
[facility] Progress Note  Patient: Kelly Wolfe EXB:[id number]   PCP: Leighton Ruff, MD DOB: 30-Jul-1931   DOA: 04/25/2017   DOS: 05/09/2017   Date of Service: the patient was seen and examined on 05/09/2017  Subjective: Documentation regarding her receiving Haldol night before last was corrected. The patient was more awake this morning as compared to yesterday. Still lethargic and tired. Unable to perform any significant ADL function.  Brief hospital course: Pt. with PMH of hypertension, pancytopenia, arthritis; admitted on 04/16/2017, presented with complaint of confusion, was found to have community-acquired pneumonia, suspected CVA, worsening bicytopenia. Currently further plan is continue current care and management in the step down unit.  Assessment and Plan: 1. Acute hypoxic respiratory failure. Community-acquired pneumonia. Currently requiring nonrebreather to maintain adequate saturation. X-ray chest this morning shows small pleural effusion as well as some vascular congestion. initially had fever continues to have leukopenia. Blood cultures so far negative. We will broaden the antibiotic coverage, vancomycin and cefepime from the Levaquin. Duo nebs as well as pulmonary toilet. Continue BiPAP when necessary. Continue IV Solu-Medrol 60 mg every 12 hours  2. Acute encephalopathy. Overnight patient had worsening of her mental status likely from IV Ativan. Patient received 1 mg Ativan at 10:30 and 1:15 night between 05/06/2017 and 05/07/2017. Following this the patient will come unresponsive. At present we will continue supportive management. . Continue to hold sedative to medications.. Patient was given Haldol reportedly per documentation, I would avoid giving any psychotropic medication unless indicated to this patient and be aware of the dose as the patient appears to be sensitive to routine doses.  3. Left basal ganglia CVA.  Delirium in the setting of  sepsis. Patient presented with confusion in the hospital. This was thought secondary to delirium. CT head unremarkable.  Full stroke workup was completed. Echo shows EF of 25%. Carotid Doppler shows no significant stenosis. Hemoglobin A1c 5.5. MRI brain shows left basal ganglia infarct. As per my discussion with neurology as the patient has evidence of involvement of caudate nucleus which can cause confusion in the long-term which might be the cause of patient's presentation to the hospital. They agreed that this size of the stroke do not represent why the patient has global lethargy and minimal responsiveness. Continue aspirin 325 mg suppository. Patient unable to take anything by mouth therefore holding statin. Not a candidate for any aggressive workup or treatment at present therefore currently holding further workup. family is in agreement. Speech therapy consult, PT OT consulted. Neurology currently recommends no further workup, as well as no indication for acute inpatient consult.  4. 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 Follows up with hematology as an outpatient. Severe neutropenia right now with worsening in the setting of infection. I discussed with on-call hematology, patient was given one dose of Granix, significant improvement in WBC as well as platelet. Continue to monitor. Transfuse as needed for platelets less than 10 or any active bleeding.  5. Acute kidney injury. Dehydration with diarrhea prior to admission Patient had diarrhea at home, no further bowel movement here. Monitor. Acute kidney injury is significantly better after aggressive IV hydration. Now worsening again. Likely due to hemodynamic mediation from severe hypotension. Currently holding diuretics. Supportive measures. Checking FE urea. FENa in the setting of diuresis may not be adequate. Urine shows presence of  6. Takatsubo cardiomyopathy. Patient presented with pneumonia, diarrhea, acute kidney injury, acute  encephalopathy. Overnight developed worsening hypoxia requiring BiPAP. Echo performed which shows EF of 25% with diffuse hypokinesis other  than inferior wall. Discussed with cardiology, this is consistent with Takatsubo cardiomyopathy and recommended supportive management. Unfortunately due to hypotension cannot tolerate beta blocker. Patient did receive multiple episodes bolus and have volume overload. Given IV Lasix. Currently on hold due to acute kidney injury. We'll monitor. Treating Underlying Infection As Well As Respiratory Distress.  7. Hypotension. In the setting of sepsis, received IV fluid bolus. Currently relatively stable. If the blood pressure improves further would actually like to use IV Lasix to maintain the patient on driver's side for respiratory distress. Also in the setting of Takatsubo cardiomyopathy. Monitor.  8. Transaminitis. AST ALT elevation consistent with alcohol-induced damage but further worsening is likely secondary to shock liver from hypotension. Currently avoid hepatotoxic medication and monitor. We will get right upper quadrant ultrasound tomorrow morning. Check ammonia level as well.  9. Acute urinary retention. Likely secondary to encephalopathy. Fully catheter inserted. Monitor in and out.  10. Goals of care discussion. Patient has a living will that mentions"desire for natural death". It also clearly mentions regarding avoiding artificial feeding, CPR, respirator. Discussed with patient's daughters, Kelly Wolfe And [name], changing CODE STATUS to partial. Palliative care consulted. We will continue to monitor for improvement in patient's condition. Patient's prognosis remains guarded in the setting of acute CVA, chronic bicytopenia, cardiomyopathy, respiratory failure, acute kidney injury as well as liver damage.  Diet: Currently nothing by mouth until fully awake. DVT Prophylaxis: mechanical compression device  Advance goals of care discussion:  partial, based on the living will and discussion with family. Continue BiPAP.  Family Communication: family was present at bedside, at the time of interview. The pt provided permission to discuss medical plan with the family. Opportunity was given to ask question and all questions were answered satisfactorily.   Disposition:  Discharge to be determined.  Consultants: Phone consultation with CCM, hematology, neurology. Palliative care Procedures: Echocardiogram, BIPAP  Antibiotics: Anti-infectives    Start     Dose/Rate Route Frequency Ordered Stop   05/09/17 1045  Ampicillin-Sulbactam (UNASYN) 3 g in sodium chloride 0.9 % 100 mL IVPB     3 g 200 mL/hr over 30 Minutes Intravenous Every 12 hours 05/09/17 1040 05/16/17 1044   05/09/17 0800  piperacillin-tazobactam (ZOSYN) IVPB 3.375 g  Status:  Discontinued     3.375 g 12.5 mL/hr over 240 Minutes Intravenous Every 8 hours 05/09/17 0731 05/09/17 0903   05/08/17 1000  levofloxacin (LEVAQUIN) IVPB 500 mg  Status:  Discontinued    Comments:  Levaquin 500 mg IV q48h for CrCL < 30 mL/min   500 mg 100 mL/hr over 60 Minutes Intravenous Every 48 hours 05/06/17 0612 05/07/17 0751   05/08/17 0900  vancomycin (VANCOCIN) 500 mg in sodium chloride 0.9 % 100 mL IVPB  Status:  Discontinued     500 mg 100 mL/hr over 60 Minutes Intravenous Every 24 hours 05/07/17 0836 05/09/17 0722   05/07/17 0900  vancomycin (VANCOCIN) IVPB 1000 mg/200 mL premix     1,000 mg 200 mL/hr over 60 Minutes Intravenous  Once 05/07/17 0836 05/07/17 1119   05/07/17 0900  ceFEPIme (MAXIPIME) 1 g in dextrose 5 % 50 mL IVPB  Status:  Discontinued     1 g 100 mL/hr over 30 Minutes Intravenous Every 24 hours 05/07/17 0836 05/09/17 0722   05/06/17 0630  levofloxacin (LEVAQUIN) IVPB 750 mg     750 mg 100 mL/hr over 90 Minutes Intravenous  Once 05/06/17 0612 05/06/17 0824       Objective: Physical Exam: Vitals:  05/09/17 1356 05/09/17 1400 05/09/17 1500 05/09/17 1545  BP:   113/81 114/79   Pulse:  85 88   Resp:  (!) 30 (!) 30   Temp:    97.8 F (36.6 C)  TempSrc:    Oral  SpO2: 95% 94% 93%   Weight:      Height:        Intake/Output Summary (Last 24 hours) at 05/09/17 1639 Last data filed at 05/09/17 1500  Gross per 24 hour  Intake              240 ml  Output              250 ml  Net              -10 ml   Filed Weights   05/04/2017 2331 05/07/17 0500 05/09/17 0500  Weight: 50.8 kg (111 lb 14.4 oz) 55.5 kg (122 lb 5.7 oz) 55.7 kg (122 lb 12.7 oz)   General: Drowsy, lethargic, Able to follow command, opening her eyes and track. Appear in severe distress, affect Flat Eyes: PERRL, Conjunctiva normal ENT: Oral Mucosa clear moist Neck: difficult to assess JVD, no Abnormal Mass Or lumps Cardiovascular: S1 and S2 Present, no Murmur, Peripheral Pulses Present Respiratory: increased respiratory effort, Bilateral Air entry equal and Decreased, positive use of accessory muscle, bilateral Crackles, expiratory wheezes Abdomen: Bowel Sound present, Soft and no tenderness, no hernia Skin: no redness, no Rash, no induration Extremities: no Pedal edema, no calf tenderness Neurologic: Withdraws to painful stimuli, follows command, unable to communicate. Data Reviewed: CBC:  Recent Labs Lab 05/04/2017 1753 05/06/17 0506 05/06/17 0645 05/07/17 0354 05/08/17 0513 05/09/17 0217  WBC 1.5* 0.9*  --  0.5* 10.3 15.1*  NEUTROABS 1.3*  --  1.0* 0.3* 9.5* 13.2*  HGB 11.1* 11.7*  --  12.8 14.0 13.6  HCT 33.2* 34.4*  --  37.1 41.0 39.9  MCV 107.1* 105.5*  --  103.3* 103.5* 103.4*  PLT 41* 38*  --  50* 95* 161*   Basic Metabolic Panel:  Recent Labs Lab 05/14/2017 1753 05/06/17 0506 05/07/17 0354 05/08/17 0407 05/09/17 0217  NA 136 139 139 140 145  K 3.9 3.7 3.7 3.9 3.7  CL 106 110 111 115* 117*  CO2 22 20* 18* 15* 17*  GLUCOSE 124* 106* 117* 142* 173*  BUN 36* 33* 26* 36* 51*  CREATININE 1.70* 1.35* 0.96 1.43* 1.99*  CALCIUM 7.7* 7.8* 7.7* 7.5* 7.6*  MG  --    --   --  2.0  --     Liver Function Tests:  Recent Labs Lab 04/17/2017 1753 05/07/17 0354 05/08/17 0407 05/09/17 0217  AST 228* 446* 574* 690*  ALT 44 57* 69* 98*  ALKPHOS 90 159* 218* 285*  BILITOT 0.9 1.0 1.0 1.0  PROT 4.6* 4.5* 4.2* 4.2*  ALBUMIN 2.9* 2.5* 2.4* 2.2*   No results for input(s): LIPASE, AMYLASE in the last 168 hours.  Recent Labs Lab 04/22/2017 2103  AMMONIA 17   Coagulation Profile:  Recent Labs Lab 05/08/17 0407  INR 1.10   Cardiac Enzymes:  Recent Labs Lab 04/17/2017 2204 04/26/2017 2320 05/06/17 0506 05/06/17 1333  TROPONINI <0.03 0.03* 0.06* 0.06*   BNP (last 3 results) No results for input(s): PROBNP in the last 8760 hours. CBG:  Recent Labs Lab 05/08/17 0002 05/08/17 0357 05/08/17 0814 05/08/17 1156 05/08/17 1620  GLUCAP 114* 133* 132* 151* 156*   Studies: US Renal  Result Date: 05/09/2017 CLINICAL DATA:  Acute renal injury. EXAM: RENAL / URINARY TRACT ULTRASOUND COMPLETE COMPARISON:  MRI 10/26/2003. FINDINGS: Right Kidney: Length: 10.1 cm. Echogenicity within normal limits. No mass or hydronephrosis visualized. Left Kidney: Length: 9.4 cm. Echogenicity within normal limits. No mass or hydronephrosis visualized. Bladder: Appears normal for degree of bladder distention. Bilateral pleural effusions. IMPRESSION: 1. No acute or focal abnormality identified. 2. Bilateral pleural effusions. Electronically Signed   By: Enoch   On: 05/09/2017 11:18    Scheduled Meds: . acetaminophen  650 mg Rectal Once  . aspirin  300 mg Rectal Daily  . carvedilol  3.125 mg Oral BID WC  . dorzolamide-timolol  1 drop Both Eyes BID  . ipratropium  0.5 mg Nebulization Q6H  . latanoprost  1 drop Both Eyes QHS  . levalbuterol  0.63 mg Nebulization Q6H  . methylPREDNISolone (SOLU-MEDROL) injection  60 mg Intravenous Q12H  . polyethylene glycol  17 g Oral Daily  . sodium chloride flush  3 mL Intravenous Q12H  . sodium chloride flush  3 mL Intravenous  Q12H  . thiamine  100 mg Oral Daily   Or  . thiamine  100 mg Intravenous Daily   Continuous Infusions: . sodium chloride 250 mL (05/09/17 0000)  . ampicillin-sulbactam (UNASYN) IV Stopped (05/09/17 1330)   PRN Meds: sodium chloride, acetaminophen, sodium chloride flush  CRITICAL CARE Performed by: Berle Mull   Total  time: 40 minutes  time spent personally by me on the following activities: development of treatment plan with patient and/or surrogate as well as nursing, discussions with consultants, evaluation of patient's response to treatment, examination of patient, obtaining history from patient or surrogate, ordering and performing treatments and interventions, ordering and review of laboratory studies, ordering and review of radiographic studies, pulse oximetry and re-evaluation of patient's condition.   Author: Berle Mull, MD [facility] Pager: [phone] 05/09/2017 4:39 PM  If 7PM-7AM, please contact night-coverage at [email or web address], password [facility]

## 2017-05-09 NOTE — Progress Notes (Signed)
CRITICAL VALUE ALERT  Critical Value:  Serum osmolality 326  Date & Time Notied:  05/09/2017 17:56  Provider Notified: text page to P. Posey Pronto MD  Orders Received/Actions taken: awaiting orders

## 2017-05-09 NOTE — Evaluation (Signed)
Clinical/Bedside Swallow Evaluation Patient Details  Name: Kelly Wolfe MRN: [id number] Date of Birth: August 25, 1931  Today's Date: 05/09/2017 Time: SLP Start Time (ACUTE ONLY): 1155 SLP Stop Time (ACUTE ONLY): 1213 SLP Time Calculation (min) (ACUTE ONLY): 18 min  Past Medical History:  Past Medical History:  Diagnosis Date  . Allergic rhinitis   . Hypertension   . Neutropenia    with elevated MCV no dx saw heme in the past neg rheum zieminski  . Osteoarthritis   . Osteopenia    mild dexa 2007 -0.7 hip sp nl  . Spinal stenosis    Past Surgical History:  Past Surgical History:  Procedure Laterality Date  . ABDOMINAL HYSTERECTOMY     total  . ROTATOR CUFF REPAIR  01/08   right   HPI:  83 y.o.femaleadmitted on 06/02/2018withPMH of hypertension, pancytopenia, arthritis; presented with complaint of confusion, was found to have community-acquired pneumonia, CVA (left basal ganglia) , worsening pancytopenia. On 05-08-17 the patient become unresponsive, developed severe hypoxia and was transferred to step down unit.     Assessment / Plan / Recommendation Clinical Impression  Pt had difficulty maintaining arousal for assessment.  Demonstrated intermittent oral holding, requiring cues to chew/swallow; no overt s/s of aspiration with limited boluses. No focal CN deficits.  Until mental status improves, allow sips/chips and meds crushed in puree.  SLP will follow for readiness to advance to PO diet.  SLP Visit Diagnosis: Dysphagia, unspecified (R13.10)    Aspiration Risk  Mild aspiration risk    Diet Recommendation   NPO except sips/chips  Medication Administration: Crushed with puree    Other  Recommendations Oral Care Recommendations: Oral care QID;Oral care prior to ice chip/H20   Follow up Recommendations  (tba)      Frequency and Duration min 2x/week  2 weeks       Prognosis Prognosis for Safe Diet Advancement: Fair      Swallow Study   General Date of Onset:  05/05/17 HPI: 83 y.o.femaleadmitted on 6/21/2018withPMH of hypertension, pancytopenia, arthritis; presented with complaint of confusion, was found to have community-acquired pneumonia, CVA (left basal ganglia) , worsening pancytopenia. On 05-08-17 the patient become unresponsive, developed severe hypoxia and was transferred to step down unit.   Type of Study: Bedside Swallow Evaluation Previous Swallow Assessment: no Diet Prior to this Study: NPO Temperature Spikes Noted: No Respiratory Status: Venti-mask History of Recent Intubation: No Behavior/Cognition: Lethargic/Drowsy Oral Cavity Assessment: Within Functional Limits Oral Care Completed by SLP: Recent completion by staff Oral Cavity - Dentition: Adequate natural dentition Vision:  (maintained eyes closed for session) Self-Feeding Abilities: Total assist Patient Positioning: Upright in bed Baseline Vocal Quality: Low vocal intensity Volitional Cough: Cognitively unable to elicit Volitional Swallow: Unable to elicit    Oral/Motor/Sensory Function Overall Oral Motor/Sensory Function: Within functional limits   Ice Chips Ice chips: Within functional limits Presentation: Spoon   Thin Liquid Thin Liquid: Impaired Oral Phase Impairments: Poor awareness of bolus Oral Phase Functional Implications: Oral holding    Nectar Thick Nectar Thick Liquid: Not tested   Honey Thick Honey Thick Liquid: Not tested   Puree Puree: Impaired Oral Phase Impairments: Poor awareness of bolus Oral Phase Functional Implications: Prolonged oral transit   Solid   GO   Solid: Not tested        Juan Quam Laurice 05/09/2017,12:49 PM

## 2017-05-09 NOTE — Progress Notes (Signed)
Pharmacy Antibiotic Note  Kelly Wolfe is a 83 y.o. female admitted on 05/04/2017 with sepsis.  Pharmacy has been consulted for Unasyn dosing. Per consult to be given for 7 days. Pt previously on vancomycin and cefepime.  Plan: - Unasyn 3g every 12 hours - Stop date of 7 days entered per consult  Height: 5' (152.4 cm) Weight: 122 lb 12.7 oz (55.7 kg) IBW/kg (Calculated) : 45.5  Temp (24hrs), Avg:97.6 F (36.4 C), Min:95.9 F (35.5 C), Max:98.6 F (37 C)   Recent Labs Lab 04/19/2017 1753 04/23/2017 2320 05/06/17 0147 05/06/17 0506 05/07/17 0354 05/07/17 0706 05/07/17 1014 05/08/17 0407 05/08/17 0513 05/09/17 0217  WBC 1.5*  --   --  0.9* 0.5*  --   --   --  10.3 15.1*  CREATININE 1.70*  --   --  1.35* 0.96  --   --  1.43*  --  1.99*  LATICACIDVEN  --  1.7 1.4  --   --  1.6 1.3  --   --   --     Estimated Creatinine Clearance: 16.5 mL/min (A) (by C-G formula based on SCr of 1.99 mg/dL (H)).    Allergies  Allergen Reactions  . Lisinopril Cough    Went away with DC (?)  . Prednisone Other (See Comments)    Nausea, shaky, unsteady, decreased appetite    Antimicrobials this admission: 6/22 Levaquin*1 6/23 Vanc > 6/24 6/23 Cefepime > 6/25 6/25 Unasyn >   Dose adjustments this admission:  Microbiology results: 6/22 BCx > ngtd  Thank you for allowing pharmacy to be a part of this patient's care.  Melburn Popper 05/09/2017 1:22 PM

## 2017-05-10 ENCOUNTER — Inpatient Hospital Stay (HOSPITAL_COMMUNITY): Payer: Medicare HMO

## 2017-05-10 DIAGNOSIS — R0602 Shortness of breath: Secondary | ICD-10-CM

## 2017-05-10 DIAGNOSIS — L8990 Pressure ulcer of unspecified site, unspecified stage: Secondary | ICD-10-CM | POA: Insufficient documentation

## 2017-05-10 DIAGNOSIS — R4182 Altered mental status, unspecified: Secondary | ICD-10-CM

## 2017-05-10 DIAGNOSIS — N179 Acute kidney failure, unspecified: Secondary | ICD-10-CM

## 2017-05-10 LAB — UREA NITROGEN, URINE: UREA NITROGEN UR: 816 mg/dL

## 2017-05-10 LAB — COMPREHENSIVE METABOLIC PANEL
ALT: 136 U/L — AB (ref 14–54)
AST: 589 U/L — ABNORMAL HIGH (ref 15–41)
Albumin: 2.2 g/dL — ABNORMAL LOW (ref 3.5–5.0)
Alkaline Phosphatase: 293 U/L — ABNORMAL HIGH (ref 38–126)
Anion gap: 10 (ref 5–15)
BUN: 64 mg/dL — ABNORMAL HIGH (ref 6–20)
CHLORIDE: 123 mmol/L — AB (ref 101–111)
CO2: 15 mmol/L — AB (ref 22–32)
Calcium: 7.6 mg/dL — ABNORMAL LOW (ref 8.9–10.3)
Creatinine, Ser: 2.09 mg/dL — ABNORMAL HIGH (ref 0.44–1.00)
GFR, EST AFRICAN AMERICAN: 24 mL/min — AB (ref >60)
GFR, EST NON AFRICAN AMERICAN: 21 mL/min — AB (ref >60)
Glucose, Bld: 165 mg/dL — ABNORMAL HIGH (ref 65–99)
POTASSIUM: 3.5 mmol/L (ref 3.5–5.1)
SODIUM: 148 mmol/L — AB (ref 135–145)
Total Bilirubin: 1 mg/dL (ref 0.3–1.2)
Total Protein: 4.1 g/dL — ABNORMAL LOW (ref 6.5–8.1)

## 2017-05-10 LAB — CBC WITH DIFFERENTIAL/PLATELET
BASOS PCT: 1 %
Basophils Absolute: 0.2 10*3/uL — ABNORMAL HIGH (ref 0.0–0.1)
EOS ABS: 0 10*3/uL (ref 0.0–0.7)
EOS PCT: 0 %
HCT: 41.7 % (ref 36.0–46.0)
Hemoglobin: 14.2 g/dL (ref 12.0–15.0)
LYMPHS ABS: 1.8 10*3/uL (ref 0.7–4.0)
LYMPHS PCT: 10 %
MCH: 35.1 pg — AB (ref 26.0–34.0)
MCHC: 34.1 g/dL (ref 30.0–36.0)
MCV: 103.2 fL — AB (ref 78.0–100.0)
MONOS PCT: 5 %
Monocytes Absolute: 0.9 10*3/uL (ref 0.1–1.0)
Neutro Abs: 15.1 10*3/uL — ABNORMAL HIGH (ref 1.7–7.7)
Neutrophils Relative %: 84 %
Platelets: 102 10*3/uL — ABNORMAL LOW (ref 150–400)
RBC: 4.04 MIL/uL (ref 3.87–5.11)
RDW: 13.7 % (ref 11.5–15.5)
WBC: 18 10*3/uL — ABNORMAL HIGH (ref 4.0–10.5)

## 2017-05-10 LAB — AMMONIA: AMMONIA: 41 umol/L — AB (ref 9–35)

## 2017-05-10 LAB — CK: CK TOTAL: 767 U/L — AB (ref 38–234)

## 2017-05-10 MED ORDER — HEPARIN SODIUM (PORCINE) 5000 UNIT/ML IJ SOLN
5000.0000 [IU] | Freq: Three times a day (TID) | INTRAMUSCULAR | Status: DC
  Filled 2017-05-10: qty 1

## 2017-05-10 MED ORDER — HEPARIN SODIUM (PORCINE) 5000 UNIT/ML IJ SOLN
5000.0000 [IU] | Freq: Three times a day (TID) | INTRAMUSCULAR | Status: DC
  Administered 2017-05-10 – 2017-05-11 (×4): 5000 [IU] via SUBCUTANEOUS
  Filled 2017-05-10 (×4): qty 1

## 2017-05-10 MED ORDER — ACETAMINOPHEN 650 MG RE SUPP: 325.0000 mg | Freq: Three times a day (TID) | RECTAL | Status: DC | PRN

## 2017-05-10 MED ORDER — LACTULOSE ENEMA
300.0000 mL | Freq: Once | ORAL | Status: AC
  Administered 2017-05-10: 300 mL via RECTAL
  Filled 2017-05-10 (×2): qty 300

## 2017-05-10 NOTE — Progress Notes (Signed)
Lactulose enema given noted small amount of black stool. Then transferred pt to air mattress.

## 2017-05-10 NOTE — Progress Notes (Signed)
Patient ID: OKEMA ROLLINSON, female   DOB: 08/24/1931, 83 y.o.   MRN: [id number]  This NP visited patient and family  at the bedside to assess for palliative needs and offer emotional support.  Family report to me that Ms Ponce has showed "no improvement" since yesterday.  "We're a little bit less optimistic today"  She is minimally responsive, unable to follow commands but  appears comfortable.   For now plan is to continue to treat the treatable and remain hopeful for continued improvement.   Discussed with famiy the importance of continued conversation with all family members and their  medical providers regarding overall plan of care and treatment options,  ensuring decisions are within the context of the patients values and GOCs.  Patient has made it clear in documented ADs she would not want prolongation of life if there is no chance of return to meaningful life.   Questions and concerns addressed  Time in  1600          Time out  1620 Total time spent on unit was 20 minutes    Greater than 50% of the time was spent in counseling and coordination of care   PMT will continue to support holistically  I informed family that I will be out of the hospital for the next five days but to call team phone with questions or concerns and that a PMT provider will f/u tomorrow.  Wadie Lessen NP  Palliative Medicine Team Team Phone # [phone] Pager [phone]

## 2017-05-10 NOTE — Progress Notes (Signed)
[facility] Progress Note  Patient: Kelly Wolfe:[id number]   PCP: Leighton Ruff, MD DOB: [birth year]   DOA: 04/24/2017   DOS: 05/10/2017   Date of Service: the patient was seen and examined on 05/10/2017  Subjective: Earlier in the morning the patient was more lethargic than compared to yesterday, unable to open her eyes, not following command. Received a lactulose enema. Later in the afternoon patient remained the same unchanged. Mildly agitated.  Brief hospital course: Pt. with PMH of hypertension, pancytopenia, arthritis; admitted on 05/03/2017, presented with complaint of confusion, was found to have community-acquired pneumonia, acute CVA, worsening bicytopenia. Received IV antibiotics for community acquired pneumonia. On aspirin for acute CVA, EF 25% on echocardiogram. Initially received IV fluid due to severe hypotension, and then received IV Lasix due to volume overload. Also has acute kidney injury as well as acute transaminitis likely from hypotension. Ultrasound abdomen shows evidence of cholecystitis. EEG 2 shows metabolic encephalopathy. Patient clearly did not want any artificial feeding and has remained nothing by mouth due to encephalopathy for last 3 days Currently further plan is continue goals of care discussion with family.  Assessment and Plan: 1. Acute hypoxic respiratory failure. Community-acquired pneumonia. Severe sepsis Initially requiring nonrebreather to maintain adequate saturation. Now on 4 L of nasal cannula. Also was on BiPAP. Presented with fever continues to have leukopenia. Blood cultures so far negative. She was started on IV Levaquin, due to worsening respiratory distress reported coverage was broadened to vancomycin and cefepime. Now that MRSA PCR is negative as well as blood cultures are negative antibiotic coverage was narrowed down to Unasyn to cover intra-abdominal pathology as well as possible aspiration event along with community  acquired pneumonia. IV salmeterol added for severe wheezing, currently BiPAP only when necessary. Continue Duo nebs as well as pulmonary toilet.  2. Acute encephalopathy. Presented with confusion, the next night patient had worsening of her mental status likely from IV Ativan. Patient received 1 mg Ativan at 10:30 and 1:15 night between 05/06/2017 and 05/07/2017. Following this the patient became unresponsive. Need to be transferred to stepdown unit and placed on BiPAP. Continue to hold sedative to medications. Repeat CT scan did not show any evidence of acute intracranial bleed. MRI brain was positive for acute CVA and left basal ganglia which might contribute to confusion. Has developed uremia, mild elevation of ammonia level, remains nothing by mouth for last 3 days as well as has severe sepsis all likely contributing to her encephalopathy. Unable to eat anything due to encephalopathy. This is the most limiting factor in patient's recovery down the road. As per neurology due to stroke in the caudate nucleus the confusion may not improve given the patient improves for other comorbidities.  3. Left basal ganglia CVA.  Patient presented with confusion in the hospital.  This was thought secondary to delirium. CT head unremarkable.  MRI brain shows left basal ganglia infarct. Full stroke workup was completed. Echo shows EF of 25%. Carotid Doppler shows no significant stenosis. Hemoglobin A1c 5.5. As per my discussion with neurology as the patient has evidence of involvement of caudate nucleus which can cause confusion in the long-term which might be the cause of patient's presentation to the hospital. Although, They agreed that this size of the stroke does not explain patient's global lethargy and minimal responsiveness. Continue aspirin 300 mg suppository.  Patient unable to take anything by mouth therefore holding statin. Not a candidate for any aggressive workup or treatment at present  therefore currently  holding further workup. family is in agreement. Speech therapy consult, PT OT consulted. Neurology currently recommends no further workup, as well as no indication for acute inpatient consult.  4. 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 Follows up with hematology as an outpatient. Severe neutropenia on presentation with worsening in the setting of infection. I discussed with on-call hematology, patient was given one dose of Granix, significant improvement in WBC as well as platelet. Continue to monitor. Transfuse as needed for platelets less than 10 or any active bleeding. Bandemia is likely secondary to Granix.  5. Acute kidney injury. Dehydration with diarrhea prior to admission Uremia. Patient had diarrhea at home. Initial Acute kidney injury improved after aggressive IV hydration.  Now worsening again. Likely due to hemodynamic mediation from severe hypotension. Currently holding diuretics. Supportive measures.  Urine microscopic shows hyaline cost only. Osmolarity elevated. FE urea currently pending. Give gentle IV hydration.  6. Takatsubo cardiomyopathy. Patient presented with pneumonia, diarrhea, acute kidney injury, acute encephalopathy.  Overnight developed worsening hypoxia requiring BiPAP. Echo performed which shows EF of 25% with diffuse hypokinesis other than inferior wall. Discussed with cardiology, this is consistent with Takatsubo cardiomyopathy and recommended supportive management. Unfortunately due to hypotension cannot tolerate beta blocker. Patient did receive multiple episodes bolus and had volume overload. Given IV Lasix. Currently on hold due to acute kidney injury.  We'll monitor, while on IV fluids.  7. Hypotension. Currently resolved  8. Transaminitis. Questionable acute cholecystitis AST ALT elevation consistent with alcohol-induced damage but further worsening is likely secondary to shock liver from hypotension. Ultrasound liver shows  evidence of gallbladder wall thickening concerning for acute cholecystitis although the patient has been on IV ceftriaxone, cefepime as well as IV Unasyn since admission and thus adequately covered. Blood culture negative. No fever. Not attended for any surgical intervention at present until shows improvement in medical condition. Currently avoid hepatotoxic medication and monitor. Ammonia level 41, will try lactulose enema and monitor.  9. Acute urinary retention. Likely secondary to encephalopathy. Foley catheter inserted. Monitor in and out.  10. Goals of care discussion. Patient has a living will that mentions"desire for natural death". It also clearly mentions regarding avoiding artificial feeding, CPR, respirator. Discussed with patient's daughters, Jeannene Patella And [name], changing CODE STATUS to partial. Palliative care consulted.  At present family wants to continue monitor patient for next 24 hours for improvement in patient's condition.  I have informed the family that Patient's prognosis remains guarded in the setting of acute CVA, chronic bicytopenia, cardiomyopathy, respiratory failure, acute kidney injury as well as liver damage. And her chance of recovery from here are significantly low.  Diet: Currently nothing by mouth until fully awake. DVT Prophylaxis subcutaneous heparin  Advance goals of care discussion: partial, based on the living will and discussion with family. Continue BiPAP.  Family Communication: family was present at bedside, at the time of interview. The pt provided permission to discuss medical plan with the family. Opportunity was given to ask question and all questions were answered satisfactorily.   Disposition:  Discharge to be determined.  Consultants: Phone consultation with CCM, hematology, neurology. Palliative care Procedures: Echocardiogram, BIPAP  Antibiotics: Anti-infectives    Start     Dose/Rate Route Frequency Ordered Stop   05/09/17 1045   Ampicillin-Sulbactam (UNASYN) 3 g in sodium chloride 0.9 % 100 mL IVPB     3 g 200 mL/hr over 30 Minutes Intravenous Every 12 hours 05/09/17 1040 05/16/17 1044   05/09/17 0800  piperacillin-tazobactam (ZOSYN) IVPB 3.375 g  Status:  Discontinued     3.375 g 12.5 mL/hr over 240 Minutes Intravenous Every 8 hours 05/09/17 0731 05/09/17 0903   05/08/17 1000  levofloxacin (LEVAQUIN) IVPB 500 mg  Status:  Discontinued    Comments:  Levaquin 500 mg IV q48h for CrCL < 30 mL/min   500 mg 100 mL/hr over 60 Minutes Intravenous Every 48 hours 05/06/17 0612 05/07/17 0751   05/08/17 0900  vancomycin (VANCOCIN) 500 mg in sodium chloride 0.9 % 100 mL IVPB  Status:  Discontinued     500 mg 100 mL/hr over 60 Minutes Intravenous Every 24 hours 05/07/17 0836 05/09/17 0722   05/07/17 0900  vancomycin (VANCOCIN) IVPB 1000 mg/200 mL premix     1,000 mg 200 mL/hr over 60 Minutes Intravenous  Once 05/07/17 0836 05/07/17 1119   05/07/17 0900  ceFEPIme (MAXIPIME) 1 g in dextrose 5 % 50 mL IVPB  Status:  Discontinued     1 g 100 mL/hr over 30 Minutes Intravenous Every 24 hours 05/07/17 0836 05/09/17 0722   05/06/17 0630  levofloxacin (LEVAQUIN) IVPB 750 mg     750 mg 100 mL/hr over 90 Minutes Intravenous  Once 05/06/17 0612 05/06/17 0824       Objective: Physical Exam: Vitals:   05/10/17 1044 05/10/17 1300 05/10/17 1334 05/10/17 1606  BP: 114/85 120/89  (!) 130/93  Pulse: 81 87  90  Resp: (!) 32 (!) 34  (!) 35  Temp: 97.8 F (36.6 C)   97.5 F (36.4 C)  TempSrc: Axillary   Axillary  SpO2: 96% 94% 97%   Weight: 54.1 kg (119 lb 4.3 oz)     Height: 5' (1.524 m)       Intake/Output Summary (Last 24 hours) at 05/10/17 1911 Last data filed at 05/10/17 1606  Gross per 24 hour  Intake             1105 ml  Output              525 ml  Net              580 ml   Filed Weights   05/07/17 0500 05/09/17 0500 05/10/17 1044  Weight: 55.5 kg (122 lb 5.7 oz) 55.7 kg (122 lb 12.7 oz) 54.1 kg (119 lb 4.3 oz)    General: Drowsy, lethargic, unable to follow command, opening her eyes and track. Appear in severe distress, affect Flat Eyes: PERRL, Conjunctiva normal ENT: Oral Mucosa clear moist Neck: difficult to assess JVD, no Abnormal Mass Or lumps Cardiovascular: S1 and S2 Present, no Murmur, Peripheral Pulses Present Respiratory: increased respiratory effort, Bilateral Air entry equal and Decreased, positive use of accessory muscle, bilateral Crackles, expiratory wheezes Abdomen: Bowel Sound present, Soft and no tenderness, no hernia Skin: no redness, no Rash, no induration Extremities: no Pedal edema, no calf tenderness Neurologic: Withdraws to painful stimuli, unable follows command, unable to communicate. Data Reviewed: CBC:  Recent Labs Lab 05/06/17 0506 05/06/17 0645 05/07/17 0354 05/08/17 0513 05/09/17 0217 05/10/17 0231  WBC 0.9*  --  0.5* 10.3 15.1* 18.0*  NEUTROABS  --  1.0* 0.3* 9.5* 13.2* 15.1*  HGB 11.7*  --  12.8 14.0 13.6 14.2  HCT 34.4*  --  37.1 41.0 39.9 41.7  MCV 105.5*  --  103.3* 103.5* 103.4* 103.2*  PLT 38*  --  50* 95* 110* 798*   Basic Metabolic Panel:  Recent Labs Lab 05/07/17 0354 05/08/17 0407 05/09/17 0217 05/09/17 1658 05/10/17 0231  NA 139 140  145 146* 148*  K 3.7 3.9 3.7 3.6 3.5  CL 111 115* 117* 121* 123*  CO2 18* 15* 17* 16* 15*  GLUCOSE 117* 142* 173* 171* 165*  BUN 26* 36* 51* 60* 64*  CREATININE 0.96 1.43* 1.99* 2.01* 2.09*  CALCIUM 7.7* 7.5* 7.6* 7.8* 7.6*  MG  --  2.0  --   --   --   PHOS  --   --   --  3.3  --     Liver Function Tests:  Recent Labs Lab 05/08/2017 1753 05/07/17 0354 05/08/17 0407 05/09/17 0217 05/09/17 1658 05/10/17 0231  AST 228* 446* 574* 690*  --  589*  ALT 44 57* 69* 98*  --  136*  ALKPHOS 90 159* 218* 285*  --  293*  BILITOT 0.9 1.0 1.0 1.0  --  1.0  PROT 4.6* 4.5* 4.2* 4.2*  --  4.1*  ALBUMIN 2.9* 2.5* 2.4* 2.2* 2.3* 2.2*   No results for input(s): LIPASE, AMYLASE in the last 168 hours.  Recent  Labs Lab 05/02/2017 2103 05/09/17 1658 05/10/17 0231  AMMONIA 17 41* 41*   Coagulation Profile:  Recent Labs Lab 05/08/17 0407 05/09/17 1656  INR 1.10 1.49   Cardiac Enzymes:  Recent Labs Lab 04/19/2017 2204 05/02/2017 2320 05/06/17 0506 05/06/17 1333 05/09/17 1656 05/10/17 0231  CKTOTAL  --   --   --   --  869* 767*  TROPONINI <0.03 0.03* 0.06* 0.06*  --   --    BNP (last 3 results) No results for input(s): PROBNP in the last 8760 hours. CBG:  Recent Labs Lab 05/08/17 0357 05/08/17 0814 05/08/17 1156 05/08/17 1620 05/09/17 2018  GLUCAP 133* 132* 151* 156* 159*   Studies: US Abdomen Limited Ruq  Result Date: 05/10/2017 CLINICAL DATA:  83 year old female with elevated LFTs. Hypertension. Alcohol use. Initial encounter. EXAM: ULTRASOUND ABDOMEN LIMITED RIGHT UPPER QUADRANT COMPARISON:  None. FINDINGS: Gallbladder: Gallbladder wall thickening measuring up to 3.2 mm. No gallstones. Minimal amount of pericholecystic fluid. Not able to determine if the patient were tender over this region as patient not coherent per ultrasound technologist. Common bile duct: Diameter: Limited evaluation and measuring up to 3.9 mm Liver: Mild increased echogenicity which may represent hepatic steatosis and/or changes of mild cirrhosis without focal mass. Right-sided pleural effusion. IMPRESSION: Mild gallbladder wall thickening without gallstones. Minimal amount of pericholecystic fluid. Not able to determine if the patient were tender over this region as patient not coherent per ultrasound technologist. Findings may be related to cirrhosis, hepatitis, increased right heart pressure or cholecystitis. Mild increased echogenicity which may represent hepatic steatosis and/or changes of mild cirrhosis. Right-sided pleural effusion. Electronically Signed   By: Genia Del M.D.   On: 05/10/2017 09:26    Scheduled Meds: . aspirin  300 mg Rectal Daily  . dorzolamide-timolol  1 drop Both Eyes BID  .  heparin subcutaneous  5,000 Units Subcutaneous Q8H  . ipratropium  0.5 mg Nebulization Q6H  . latanoprost  1 drop Both Eyes QHS  . levalbuterol  0.63 mg Nebulization Q6H  . mouth rinse  15 mL Mouth Rinse BID  . methylPREDNISolone (SOLU-MEDROL) injection  60 mg Intravenous Q12H  . polyethylene glycol  17 g Oral Daily  . sodium chloride flush  3 mL Intravenous Q12H  . sodium chloride flush  3 mL Intravenous Q12H  . thiamine  100 mg Oral Daily   Or  . thiamine  100 mg Intravenous Daily   Continuous Infusions: . sodium chloride  Stopped (05/09/17 2100)  . sodium chloride 50 mL/hr at 05/10/17 0400  . ampicillin-sulbactam (UNASYN) IV Stopped (05/10/17 1031)   PRN Meds: sodium chloride, acetaminophen, sodium chloride flush  Total  time: 40 minutes  Author: Berle Mull, MD [facility] Pager: [phone] 05/10/2017 7:11 PM  If 7PM-7AM, please contact night-coverage at [email or web address], password [facility]

## 2017-05-10 NOTE — Progress Notes (Signed)
SLP Cancellation Note  Patient Details Name: DARRIA CORVERA MRN: [id number] DOB: Jul 31, 1931   Cancelled treatment:       Reason Eval/Treat Not Completed: Pt somnolent.  Will follow for readiness.   Juan Quam Laurice 05/10/2017, 3:07 PM

## 2017-05-10 NOTE — Procedures (Signed)
ELECTROENCEPHALOGRAM REPORT  Date of Study: 05/10/2017  Patient's Name: Kelly Wolfe MRN: [id number] Date of Birth: [birth year]  Referring Provider: Dr. Berle Mull  Clinical History: This is an 83 year old woman with confusion.  Medications: acetaminophen (TYLENOL) suppository 325 mg  Ampicillin-Sulbactam (UNASYN) 3 g in sodium chloride 0.9 % 100 mL IVPB  aspirin suppository 300 mg  dorzolamide-timolol (COSOPT) 22.3-6.8 MG/ML ophthalmic solution 1 drop  heparin injection 5,000 Units  ipratropium (ATROVENT) nebulizer solution 0.5 mg  latanoprost (XALATAN) 0.005 % ophthalmic solution 1 drop  levalbuterol (XOPENEX) nebulizer solution 0.63 mg  MEDLINE mouth rinse  methylPREDNISolone sodium succinate (SOLU-MEDROL) 125 mg/2 mL injection 60 mg  polyethylene glycol (MIRALAX / GLYCOLAX) packet 17 g  thiamine (B-1) injection 100 mg   Technical Summary: A multichannel digital EEG recording measured by the international 10-20 system with electrodes applied with paste and impedances below 5000 ohms performed as portable with EKG monitoring in an awake and asleep patient.  Hyperventilation and photic stimulation were not performed.  The digital EEG was referentially recorded, reformatted, and digitally filtered in a variety of bipolar and referential montages for optimal display.   Description: The patient is awake and asleep during the recording.  There is no clear posterior dominant rhythm. The background consists of a large amount of diffuse 4-5 Hz theta and 2-3 Hz delta slowing, with triphasic waves seen. During drowsiness and sleep, there is an increase in theta and delta slowing of the background with poorly formed sleep spindles seen. Hyperventilation and photic stimulation were not performed.  There were no epileptiform discharges or electrographic seizures seen.    EKG lead was unremarkable.  Impression: This awake and asleep EEG is abnormal due to moderate diffuse slowing of the  background with triphasic waves seen.  Clinical Correlation of the above findings indicates diffuse cerebral dysfunction that is non-specific in etiology and can be seen with hypoxic/ischemic injury, toxic/metabolic encephalopathies, neurodegenerative disorders, or medication effect. Triphasic waves are typically seen with hepatic encephalopathy but can be seen with other metabolic encephalopathies as well. The absence of epileptiform discharges does not rule out a clinical diagnosis of epilepsy.  Clinical correlation is advised.   Ellouise Newer, M.D.

## 2017-05-10 NOTE — Progress Notes (Signed)
EEG completed, results pending. 

## 2017-05-10 NOTE — Progress Notes (Signed)
Patient ID: Kayler D Magistro, female   DOB: 05/17/1931, 83 y.o.   MRN: [id number]  This NP visited patient at the bedside as a follow up to  yesterday's GOCs meeting. Met with patient's two daughters.  Family feel encouraged by improvements they see today.  Ms Hazell is a bit more alert and has taken a few bites of applesauce.    For now plan is to continue to treat the treatable and remain hopeful for continued improvement.   Discussed with famiy the importance of continued conversation with family and their  medical providers regarding overall plan of care and treatment options,  ensuring decisions are within the context of the patients values and GOCs.  Patient has made it clear in documented ADs she would not want prolongation of life if there is no chance of return to meaningful life.   Questions and concerns addressed  Time in  1300          Time out  1335 Total time spent on unit was 35 minutes    Greater than 50% of the time was spent in counseling and coordination of care   PMT will continue to support holistically  Mary Larach NP  Palliative Medicine Team Team Phone # [phone] Pager [phone]   

## 2017-05-10 NOTE — Progress Notes (Signed)
PT Cancellation Note  Patient Details Name: Kelly Wolfe MRN: [id number] DOB: [birth year]   Cancelled Treatment:    Reason Eval/Treat Not Completed: Fatigue/lethargy limiting ability to participate. Pt somnolent.    [place] 05/10/2017, 9:45 AM Suanne Marker PT [phone]

## 2017-05-11 DIAGNOSIS — J9601 Acute respiratory failure with hypoxia: Secondary | ICD-10-CM

## 2017-05-11 DIAGNOSIS — Z515 Encounter for palliative care: Secondary | ICD-10-CM

## 2017-05-11 DIAGNOSIS — R945 Abnormal results of liver function studies: Secondary | ICD-10-CM

## 2017-05-11 DIAGNOSIS — R7989 Other specified abnormal findings of blood chemistry: Secondary | ICD-10-CM

## 2017-05-11 DIAGNOSIS — J189 Pneumonia, unspecified organism: Secondary | ICD-10-CM

## 2017-05-11 LAB — CBC WITH DIFFERENTIAL/PLATELET
Basophils Absolute: 0.2 10*3/uL — ABNORMAL HIGH (ref 0.0–0.1)
Basophils Relative: 1 %
EOS PCT: 0 %
Eosinophils Absolute: 0 10*3/uL (ref 0.0–0.7)
HEMATOCRIT: 44 % (ref 36.0–46.0)
Hemoglobin: 14.7 g/dL (ref 12.0–15.0)
Lymphocytes Relative: 11 %
Lymphs Abs: 2 10*3/uL (ref 0.7–4.0)
MCH: 36.2 pg — ABNORMAL HIGH (ref 26.0–34.0)
MCHC: 33.4 g/dL (ref 30.0–36.0)
MCV: 108.4 fL — AB (ref 78.0–100.0)
MONOS PCT: 7 %
Monocytes Absolute: 1.3 10*3/uL — ABNORMAL HIGH (ref 0.1–1.0)
Neutro Abs: 14.9 10*3/uL — ABNORMAL HIGH (ref 1.7–7.7)
Neutrophils Relative %: 81 %
Platelets: 76 10*3/uL — ABNORMAL LOW (ref 150–400)
RBC: 4.06 MIL/uL (ref 3.87–5.11)
RDW: 14.4 % (ref 11.5–15.5)
WBC: 18.4 10*3/uL — ABNORMAL HIGH (ref 4.0–10.5)

## 2017-05-11 LAB — COMPREHENSIVE METABOLIC PANEL
ALK PHOS: 269 U/L — AB (ref 38–126)
ALT: 154 U/L — AB (ref 14–54)
AST: 314 U/L — AB (ref 15–41)
Albumin: 2.2 g/dL — ABNORMAL LOW (ref 3.5–5.0)
Anion gap: 8 (ref 5–15)
BILIRUBIN TOTAL: 0.9 mg/dL (ref 0.3–1.2)
BUN: 74 mg/dL — AB (ref 6–20)
CALCIUM: 7.3 mg/dL — AB (ref 8.9–10.3)
CO2: 19 mmol/L — ABNORMAL LOW (ref 22–32)
CREATININE: 2.14 mg/dL — AB (ref 0.44–1.00)
Chloride: 128 mmol/L — ABNORMAL HIGH (ref 101–111)
GFR calc Af Amer: 23 mL/min — ABNORMAL LOW (ref >60)
GFR calc non Af Amer: 20 mL/min — ABNORMAL LOW (ref >60)
Glucose, Bld: 190 mg/dL — ABNORMAL HIGH (ref 65–99)
Potassium: 4 mmol/L (ref 3.5–5.1)
Sodium: 155 mmol/L — ABNORMAL HIGH (ref 135–145)
TOTAL PROTEIN: 4.1 g/dL — AB (ref 6.5–8.1)

## 2017-05-11 LAB — CULTURE, BLOOD (ROUTINE X 2)
Culture: NO GROWTH
Culture: NO GROWTH

## 2017-05-11 LAB — AMMONIA: Ammonia: 54 umol/L — ABNORMAL HIGH (ref 9–35)

## 2017-05-11 LAB — CK: CK TOTAL: 553 U/L — AB (ref 38–234)

## 2017-05-11 MED ORDER — MORPHINE SULFATE (PF) 2 MG/ML IV SOLN: 2.0000 mg | INTRAVENOUS | Status: DC | PRN

## 2017-05-11 MED ORDER — SODIUM CHLORIDE 0.9 % IV SOLN: 3.0000 g | INTRAVENOUS | Status: DC

## 2017-05-11 MED ORDER — MORPHINE SULFATE (CONCENTRATE) 10 MG/0.5ML PO SOLN: 5.0000 mg | ORAL | Status: DC | PRN

## 2017-05-11 MED ORDER — SODIUM CHLORIDE 0.9 % IV BOLUS (SEPSIS)
250.0000 mL | Freq: Once | INTRAVENOUS | Status: AC
  Administered 2017-05-11: 250 mL via INTRAVENOUS

## 2017-05-11 MED ORDER — GLYCOPYRROLATE 0.2 MG/ML IJ SOLN: 0.2000 mg | INTRAMUSCULAR | Status: DC | PRN

## 2017-05-15 NOTE — Care Management Note (Signed)
Case Management Note  Patient Details  Name: Kelly Wolfe MRN: [id number] Date of Birth: Jun 07, 1931            Action/Plan:  Pt is currently comfort care only with anticipating hospital death. Family is at bedside.   Expected Discharge Date:                Expected Discharge Plan:  [place]  In-House Referral:  Clinical Social Work  Discharge planning Services  CM Consult  Post Acute Care Choice:  NA Choice offered to:  NA  DME Arranged:  N/A DME Agency:  NA  HH Arranged:  NA HH Agency:  NA  Status of Service:  Completed, signed off  If discussed at [place] of Stay Meetings, dates discussed:    Additional Comments:  Erenest Rasher, RN 2017/06/06, 5:37 PM

## 2017-05-15 NOTE — Discharge Summary (Signed)
Death Summary  Kelly Wolfe HGD:[id number] DOB: 07/18/1931 DOA: 05-18-2017  PCP: Leighton Ruff, MD   Admit date: May 18, 2017 Date of Death: 2017-05-25  Final Diagnoses:  Principal Problem:   Altered mental status Active Problems:   Neutropenia ([place])   HTN (hypertension)   Expressive aphasia   Syncope   AKI (acute kidney injury) ([place])   Dehydration   CAP (community acquired pneumonia)   Palliative care by specialist   SOB (shortness of breath)   Pressure injury of skin   LFT elevation   Acute respiratory failure with hypoxia ([place])   Comfort measures only status     History of present illness:  Pt. with PMH of hypertension, pancytopenia, arthritis; admitted on 18-May-2017, presented with complaint of confusion, was found to have community-acquired pneumonia, acute CVA, worsening bicytopenia. Received IV antibiotics for community acquired pneumonia. On aspirin for acute CVA, EF 25% on echocardiogram. Initially received IV fluid due to severe hypotension, and then received IV Lasix due to volume overload. Also had acute kidney injury as well as acute transaminitis likely from hypotension. Ultrasound abdomen shows evidence of cholecystitis. EEG 2 shows metabolic encephalopathy.   Hospital Course:   1. Acute hypoxic respiratory failure. Community-acquired pneumonia. Severe sepsis Initially requiring nonrebreather to maintain adequate saturation. Also was on BiPAP. Blood cultures negative. She was started on IV Levaquin, due to worsening respiratory distress reported coverage was broadened to vancomycin and cefepime. Coverage was narrowed down to Unasyn to cover intra-abdominal pathology as well as possible aspiration event along with community acquired pneumonia. Patient was also on IV steroids. BiPAP only as needed. Patient took a turn for the worse on the morning of 2023-05-25. She was more unresponsive with the hypotension and hypoxia. Discussed with family. She was made comfort care  at that point in time. Patient subsequently passed away at 6:47 PM on 05/25/23.   2. Acute encephalopathy. Initially her encephalopathy was thought to be due to medications. However, she did not improve. Had to be transferred to stepdown and placed on BiPAP. Sedative medications were placed on hold. Repeat CT scan did not show any evidence of acute intracranial bleed. MRI brain was positive for acute CVA and left basal ganglia which might contribute to confusion.  3. Left basal ganglia CVA.  Patient presented with confusion in the hospital. This was thought secondary to delirium. CT head unremarkable. MRI brain shows left basal ganglia infarct. Full stroke workup was completed. Echo shows EF of 25%. Carotid Doppler shows no significant stenosis. Hemoglobin A1c 5.5. Patient not a candidate for any aggressive workup or treatment. Case was briefly discussed with neurology.  4. 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 Follows up with hematology as an outpatient. Severe neutropenia on presentation with worsening in the setting of infection. Patient was given Granix with improvement in WBC and platelet count.  Bandemia is likely secondary to Granix.  5. Acute kidney injury. Dehydration with diarrhea prior to admission Uremia. Initial Acute kidney injury improved after aggressive IV hydration.   6. Takatsubo cardiomyopathy. Patient presented with pneumonia, diarrhea, acute kidney injury, acute encephalopathy. Overnight developed worsening hypoxia requiring BiPAP. Echo performed which shows EF of 25% with diffuse hypokinesis other than inferior wall. Discussed with cardiology, this is consistent with Takatsubo cardiomyopathy.  7. Hypotension Patient's blood pressure remains low.  8. Transaminitis. Questionable acute cholecystitis AST ALT elevation consistent with alcohol-induced damage but further worsening is likely secondary to shock liver from hypotension. Ultrasound liver shows evidence of  gallbladder wall thickening concerning for  acute cholecystitis although the patient has been on IV ceftriaxone, cefepime as well as IV Unasyn since admission and thus adequately covered.  9. Acute urinary retention. Likely secondary to encephalopathy.  10. Goals of care discussion. Patient has a living will that mentions"desire for natural death". It also clearly mentions regarding avoiding artificial feeding, CPR, respirator. Palliative care consulted. She continued to decline.       The results of significant diagnostics from this hospitalization (including imaging, microbiology, ancillary and laboratory) are listed below for reference.    Significant Diagnostic Studies: Dg Chest 2 View  Result Date: 05/07/2017 CLINICAL DATA:  Altered mental status. EXAM: CHEST  2 VIEW COMPARISON:  None. FINDINGS: Cardiomediastinal silhouette is normal, calcified aortic knob. Low lung volumes within diffusely prominent interstitium, no pleural effusion or focal consolidation. No pneumothorax. Osteopenia. Soft tissue planes and included osseous structures are nonsuspicious. IMPRESSION: Interstitial prominence concern for atypical infection without focal consolidation. Electronically Signed   By: Elon Alas M.D.   On: 04/24/2017 22:26   Ct Head Wo Contrast  Result Date: 05/07/2017 CLINICAL DATA:  Patient was found unresponsive in respiratory distress, patient non-verbal for history EXAM: CT HEAD WITHOUT CONTRAST TECHNIQUE: Contiguous axial images were obtained from the base of the skull through the vertex without intravenous contrast. COMPARISON:  04/29/2017 FINDINGS: Brain: No evidence of acute infarction, hemorrhage, hydrocephalus, extra-axial collection or mass lesion/mass effect. There is age appropriate ventricular and sulcal enlargement. Vascular: No hyperdense vessel or unexpected calcification. Skull: Normal. Negative for fracture or focal lesion. Sinuses/Orbits: Globes and orbits are  unremarkable. Visualized sinuses and mastoid air cells are clear. Other: None. IMPRESSION: 1. No acute intracranial abnormalities. Stable appearance from the prior study. Electronically Signed   By: Lajean Manes M.D.   On: 05/07/2017 18:44   Ct Head Wo Contrast  Result Date: 04/18/2017 CLINICAL DATA:  Pt unable to provide hx. Per ED notes: Per [facility], Pt from home where she lives alone. Pt normally talking and walking independently. Armed forces operational officer came over and found pt on back porch staring into space. EXAM: CT HEAD WITHOUT CONTRAST TECHNIQUE: Contiguous axial images were obtained from the base of the skull through the vertex without intravenous contrast. COMPARISON:  None. FINDINGS: Brain: There is mild central and cortical atrophy. Remote lacunar infarct is identified within the left basal ganglia. There is no intra or extra-axial fluid collection or mass lesion. The basilar cisterns and ventricles have a normal appearance. There is no CT evidence for acute infarction or hemorrhage. Vascular: There is atherosclerotic calcification of the carotid siphons. Skull: Normal. Negative for fracture or focal lesion. Sinuses/Orbits: No acute finding. Other: Small amount of gas is identified within the cavernous sinuses, consistent with recently placed intravenous line. IMPRESSION: No evidence for acute intracranial abnormality. Remote lacunar infarct of the left basal ganglia. Mild atrophy. Electronically Signed   By: Nolon Nations M.D.   On: 04/15/2017 18:39   Mr Brain Wo Contrast  Result Date: 05/07/2017 CLINICAL DATA:  Confusion. History of hypertension, syncope, hyperlipidemia. EXAM: MRI HEAD WITHOUT CONTRAST TECHNIQUE: Multiplanar, multiecho pulse sequences of the brain and surrounding structures were obtained without intravenous contrast. COMPARISON:  CT HEAD May 07, 2017 1809 hours FINDINGS: Multiple sequences are moderately or severely motion degraded. BRAIN: Faint reduced diffusion LEFT basal ganglia  with low ADC value. LEFT inferior basal ganglia perivascular space. No susceptibility artifact to suggest hemorrhage though motion decreases sensitivity. The ventricles and sulci are normal for patient's age. Minimal supratentorial white matter T2 hyperintensities compatible  with chronic small vessel ischemic disease, less than expected for age. No suspicious parenchymal signal, masses or mass effect. No abnormal extra-axial fluid collections. VASCULAR: Normal major intracranial vascular flow voids present at skull base. SKULL AND UPPER CERVICAL SPINE: No abnormal sellar expansion. No suspicious calvarial bone marrow signal. Craniocervical junction maintained. SINUSES/ORBITS: The mastoid air-cells and included paranasal sinuses are well-aerated. The included ocular globes and orbital contents are non-suspicious. Status post bilateral ocular lens implants. OTHER: None. IMPRESSION: Moderate to severely degraded examination. Acute LEFT basal ganglia nonhemorrhagic infarct. Otherwise negative MRI of the head for age. Electronically Signed   By: Elon Alas M.D.   On: 05/07/2017 23:39   US Renal  Result Date: 05/09/2017 CLINICAL DATA:  Acute renal injury. EXAM: RENAL / URINARY TRACT ULTRASOUND COMPLETE COMPARISON:  MRI 10/26/2003. FINDINGS: Right Kidney: Length: 10.1 cm. Echogenicity within normal limits. No mass or hydronephrosis visualized. Left Kidney: Length: 9.4 cm. Echogenicity within normal limits. No mass or hydronephrosis visualized. Bladder: Appears normal for degree of bladder distention. Bilateral pleural effusions. IMPRESSION: 1. No acute or focal abnormality identified. 2. Bilateral pleural effusions. Electronically Signed   By: Marcello Moores  Register   On: 05/09/2017 11:18   Dg Chest Port 1 View  Result Date: 05/08/2017 CLINICAL DATA:  Shortness of breath.  Tachypnea. EXAM: PORTABLE CHEST 1 VIEW COMPARISON:  05/07/2017 FINDINGS: Cardiac silhouette is grossly unchanged in size allowing for new  obscuration of the right heart border. Aortic atherosclerosis is noted. There is pulmonary vascular congestion with increasing hazy and patchy opacities in the perihilar and basilar regions bilaterally. Increasing veiling opacities in both lung bases likely reflect enlarging pleural effusions. No pneumothorax is identified. No acute osseous abnormality is seen. IMPRESSION: Increasing lung opacities compatible with edema and pleural effusions. Electronically Signed   By: Logan Bores M.D.   On: 05/08/2017 09:50   Dg Chest Port 1 View  Result Date: 05/07/2017 CLINICAL DATA:  Increase shortness-of-breath and fever. EXAM: PORTABLE CHEST 1 VIEW COMPARISON:  04/27/2017 FINDINGS: Lungs are adequately inflated demonstrate mild prominence of the perihilar markings without significant change. Mild increased hazy density over the lung bases which may be due to small effusions with atelectasis. Cardiomediastinal silhouette is within normal. There is calcified plaque over the aortic arch. Mild degenerative change of the spine. IMPRESSION: Persistent mild prominence of the perihilar markings suggesting mild vascular congestion. Mild bibasilar hazy density with blunting of the costophrenic angles suggesting small effusions/atelectasis. Aortic atherosclerosis. Electronically Signed   By: Marin Olp M.D.   On: 05/07/2017 09:16   US Abdomen Limited Ruq  Result Date: 05/10/2017 CLINICAL DATA:  83 year old female with elevated LFTs. Hypertension. Alcohol use. Initial encounter. EXAM: ULTRASOUND ABDOMEN LIMITED RIGHT UPPER QUADRANT COMPARISON:  None. FINDINGS: Gallbladder: Gallbladder wall thickening measuring up to 3.2 mm. No gallstones. Minimal amount of pericholecystic fluid. Not able to determine if the patient were tender over this region as patient not coherent per ultrasound technologist. Common bile duct: Diameter: Limited evaluation and measuring up to 3.9 mm Liver: Mild increased echogenicity which may represent  hepatic steatosis and/or changes of mild cirrhosis without focal mass. Right-sided pleural effusion. IMPRESSION: Mild gallbladder wall thickening without gallstones. Minimal amount of pericholecystic fluid. Not able to determine if the patient were tender over this region as patient not coherent per ultrasound technologist. Findings may be related to cirrhosis, hepatitis, increased right heart pressure or cholecystitis. Mild increased echogenicity which may represent hepatic steatosis and/or changes of mild cirrhosis. Right-sided pleural effusion. Electronically Signed  By: Genia Del M.D.   On: 05/10/2017 09:26    Microbiology: Recent Results (from the past 240 hour(s))  Culture, blood (Routine X 2) w Reflex to ID Panel     Status: None   Collection Time: 05/06/17  6:45 AM  Result Value Ref Range Status   Specimen Description BLOOD RIGHT ANTECUBITAL  Final   Special Requests IN PEDIATRIC BOTTLE BCAV  Final   Culture NO GROWTH 5 DAYS  Final   Report Status 06/06/17 FINAL  Final  Culture, blood (Routine X 2) w Reflex to ID Panel     Status: None   Collection Time: 05/06/17  6:45 AM  Result Value Ref Range Status   Specimen Description BLOOD RIGHT HAND  Final   Special Requests IN PEDIATRIC BOTTLE BCAV  Final   Culture NO GROWTH 5 DAYS  Final   Report Status 06-Jun-2017 FINAL  Final  MRSA PCR Screening     Status: None   Collection Time: 05/07/17  6:51 AM  Result Value Ref Range Status   MRSA by PCR NEGATIVE NEGATIVE Final    Comment:        The GeneXpert MRSA Assay (FDA approved for NASAL specimens only), is one component of a comprehensive MRSA colonization surveillance program. It is not intended to diagnose MRSA infection nor to guide or monitor treatment for MRSA infections.      Labs: Basic Metabolic Panel:  Recent Labs Lab 05/08/17 0407 05/09/17 0217 05/09/17 1658 05/10/17 0231 Jun 06, 2017 0344  NA 140 145 146* 148* 155*  K 3.9 3.7 3.6 3.5 4.0  CL 115* 117* 121*  123* 128*  CO2 15* 17* 16* 15* 19*  GLUCOSE 142* 173* 171* 165* 190*  BUN 36* 51* 60* 64* 74*  CREATININE 1.43* 1.99* 2.01* 2.09* 2.14*  CALCIUM 7.5* 7.6* 7.8* 7.6* 7.3*  MG 2.0  --   --   --   --   PHOS  --   --  3.3  --   --    Liver Function Tests:  Recent Labs Lab 05/07/17 0354 05/08/17 0407 05/09/17 0217 05/09/17 1658 05/10/17 0231 06-Jun-2017 0344  AST 446* 574* 690*  --  589* 314*  ALT 57* 69* 98*  --  136* 154*  ALKPHOS 159* 218* 285*  --  293* 269*  BILITOT 1.0 1.0 1.0  --  1.0 0.9  PROT 4.5* 4.2* 4.2*  --  4.1* 4.1*  ALBUMIN 2.5* 2.4* 2.2* 2.3* 2.2* 2.2*   No results for input(s): LIPASE, AMYLASE in the last 168 hours.  Recent Labs Lab 05/14/2017 2103 05/09/17 1658 05/10/17 0231 06/06/2017 0344  AMMONIA 17 41* 41* 54*   CBC:  Recent Labs Lab 05/07/17 0354 05/08/17 0513 05/09/17 0217 05/10/17 0231 06-Jun-2017 0344  WBC 0.5* 10.3 15.1* 18.0* 18.4*  NEUTROABS 0.3* 9.5* 13.2* 15.1* 14.9*  HGB 12.8 14.0 13.6 14.2 14.7  HCT 37.1 41.0 39.9 41.7 44.0  MCV 103.3* 103.5* 103.4* 103.2* 108.4*  PLT 50* 95* 110* 102* 76*   Cardiac Enzymes:  Recent Labs Lab 04/18/2017 2204 04/23/2017 2320 05/06/17 0506 05/06/17 1333 05/09/17 1656 05/10/17 0231 06/06/2017 0344  CKTOTAL  --   --   --   --  869* 767* 553*  TROPONINI <0.03 0.03* 0.06* 0.06*  --   --   --    CBG:  Recent Labs Lab 05/08/17 0357 05/08/17 0814 05/08/17 1156 05/08/17 1620 05/09/17 2018  GLUCAP 133* 132* 151* 156* 159*   Urinalysis    Component Value  Date/Time   COLORURINE YELLOW 05/09/2017 0950   APPEARANCEUR HAZY (A) 05/09/2017 0950   LABSPEC 1.016 05/09/2017 0950   PHURINE 5.0 05/09/2017 0950   GLUCOSEU NEGATIVE 05/09/2017 0950   GLUCOSEU NEGATIVE 01/07/2010 1406   HGBUR MODERATE (A) 05/09/2017 0950   HGBUR negative 12/03/2010 0844   BILIRUBINUR NEGATIVE 05/09/2017 0950   BILIRUBINUR Neg 09/05/2013 1237   KETONESUR NEGATIVE 05/09/2017 0950   PROTEINUR 30 (A) 05/09/2017 0950    UROBILINOGEN 0.2 09/05/2013 1237   UROBILINOGEN 0.2 12/03/2010 0844   NITRITE NEGATIVE 05/09/2017 0950   LEUKOCYTESUR NEGATIVE 05/09/2017 0950    [facility] 05/12/2017, 4:14 PM

## 2017-05-15 NOTE — Progress Notes (Signed)
RT called to room to assess patient for BiPAP. When I arrived, agonal breathing noted, SAT 87% on NRB. Attempted neb treatment while waiting on BiPAP machine, SAT dropped into the low 80's. MD at bedside stated no BiPAP at this time. RN aware. Patient placed back on NRB.

## 2017-05-15 NOTE — Progress Notes (Signed)
Daily Progress Note   Patient Name: Kelly Wolfe       Date: 06/04/17 DOB: Jan 20, 1931  Age: 83 y.o. MRN#: [id number] Attending Physician: Bonnielee Haff, MD Primary Care Physician: Leighton Ruff, MD Admit Date: 04/21/2017  Reason for Consultation/Follow-up: Establishing goals of care  Subjective: Patient unresponsive to sternal rub. Appears comfortable with no signs or symptoms of distress. Remains on NRB mask.   Family at bedside. Discussed clinical decline this AM with transition to comfort measures only. Focus on comfort and dignity at the end-of-life. Educated on EOL expectations and medications as needed for symptom management. Answered questions and concerns. Provided emotional and spiritual support. Declining chaplain at this time--patient's pastor has visited.   Length of Stay: 4  Current Medications: Scheduled Meds:  . dorzolamide-timolol  1 drop Both Eyes BID  . latanoprost  1 drop Both Eyes QHS  . mouth rinse  15 mL Mouth Rinse BID  . sodium chloride flush  3 mL Intravenous Q12H  . sodium chloride flush  3 mL Intravenous Q12H    Continuous Infusions: . sodium chloride Stopped (05/09/17 2100)  . sodium chloride 50 mL/hr at June 04, 2017 1109  . [START ON 05/12/2017] ampicillin-sulbactam (UNASYN) IV      PRN Meds: sodium chloride, acetaminophen, glycopyrrolate, morphine injection, morphine CONCENTRATE, sodium chloride flush  Physical Exam  Constitutional: She appears lethargic. She appears ill.  HENT:  Head: Normocephalic and atraumatic.  Cyanotic lips  Pulmonary/Chest: No accessory muscle usage. No tachypnea. No respiratory distress.  Shallow respirations. Periods of apnea  Neurological: She appears lethargic.  Skin: Skin is warm.  Dusky, no mottling noted    Nursing note and vitals reviewed.          Vital Signs: BP (!) 39/31   Pulse (!) 51   Temp 97.6 F (36.4 C) (Axillary)   Resp (!) 28   Ht 5' (1.524 m)   Wt 54.3 kg (119 lb 11.2 oz)   SpO2 (!) 84%   BMI 23.38 kg/m  SpO2: SpO2: (!) 84 % O2 Device: O2 Device: NRB O2 Flow Rate: O2 Flow Rate (L/min): 15 L/min  Intake/output summary:   Intake/Output Summary (Last 24 hours) at 04-Jun-2017 1650 Last data filed at 04-Jun-2017 1109  Gross per 24 hour  Intake  1207.5 ml  Output              160 ml  Net           1047.5 ml   LBM: Last BM Date: 05/10/17 Baseline Weight: Weight: 50.8 kg (111 lb 14.4 oz) Most recent weight: Weight: 54.3 kg (119 lb 11.2 oz)       Palliative Assessment/Data: PPS 10%   Flowsheet Rows     Most Recent Value  Intake Tab  Referral Department  Hospitalist  Unit at Time of Referral  ICU  Palliative Care Primary Diagnosis  Pulmonary  Date Notified  05/08/17  Palliative Care Type  New Palliative care  Reason for referral  Clarify Goals of Care  Date of Admission  05/07/2017  Date first seen by Palliative Care  05/08/17  # of days Palliative referral response time  0 Day(s)  # of days IP prior to Palliative referral  3  Clinical Assessment  Psychosocial & Spiritual Assessment  Palliative Care Outcomes      Patient Active Problem List   Diagnosis Date Noted  . Pressure injury of skin 05/10/2017  . SOB (shortness of breath)   . Palliative care by specialist 05/08/2017  . CAP (community acquired pneumonia) 05/07/2017  . Altered mental status 04/23/2017  . Expressive aphasia 05/10/2017  . Syncope 05/03/2017  . AKI (acute kidney injury) ([place]) 05/12/2017  . Dehydration 04/15/2017  . Macrocytosis without anemia 01/30/2016  . Hyperglycemia 07/03/2014  . Excess wax in ear 07/03/2014  . DNR (do not resuscitate) discussion 07/03/2014  . Malaise and fatigue 11/10/2012  . HTN (hypertension) 10/31/2012  . Elevated blood pressure reading 09/05/2012  .  High risk medication use 07/04/2012  . Preventative health care 12/08/2011  . Sinus disorder 12/08/2011  . Depressive reaction 07/01/2011  . Hearing loss 03/31/2011  . Tinnitus of right ear 03/31/2011  . Dizziness 03/31/2011  . Asymmetrical hearing loss 03/31/2011  . Neutropenia ([place])   . Osteopenia   . MALAISE AND FATIGUE 12/03/2010  . URINALYSIS, ABNORMAL 12/22/2009  . [place], MILD 04/22/2008  . SLEEPLESSNESS 04/22/2008  . TINNITUS 10/04/2007  . NEUTROPENIA NOS 07/25/2007  . ALLERGIC RHINITIS 07/25/2007  . OSTEOARTHRITIS 07/25/2007  . SPINAL STENOSIS 07/25/2007  . OSTEOPENIA 07/25/2007    Palliative Care Assessment & Plan   Patient Profile: 83 y.o. female   admitted on 04/17/2017 with  PMH of hypertension, pancytopenia, arthritis; presented with complaint of confusion, was found to have community-acquired pneumonia, suspected CVA, worsening pancytopenia. Then on 05-08-17  the patient become unresponsive, developed severe hypoxia and need to be transferred to step down unit and was placed on BiPAP. In morning the patient was initially unresponsive but later on was able to open up her eyes and track Head CTA positive for  acute LEFT basal ganglia nonhemorrhagic infarct. Otherwise negative.  Assessment: Acute respiratory failure Acute encephalopathy Severe sepsis Community-acquired pneumonia CVA Transaminitis Pancytopenia CHF with EF 25% Acute kidney injury  Dehydration  Recommendations/Plan:  Transitioned to comfort measures this AM. Likely hours. Anticipate hospital death.  Symptom management  Morphine 2mg  IV q1h prn pain/dyspnea/air hunger  Roxanol 5mg  SL q1h prn pain/dyspnea/air hunger  Robinul 0.2 mg IV q4h prn secretions  Discussed EOL expectations with family at bedside.   PMT will continue to support patient and family at EOL  Goals of Care and Additional Recommendations:  Limitations on Scope of Treatment: Full Comfort Care  Code  Status: DNR   Code Status Orders  Start     Ordered   09-Jun-2017 1125  Do not attempt resuscitation (DNR)  Continuous    Question Answer Comment  In the event of cardiac or respiratory ARREST Do not call a "code blue"   In the event of cardiac or respiratory ARREST Do not perform Intubation, CPR, defibrillation or ACLS   In the event of cardiac or respiratory ARREST Use medication by any route, position, wound care, and other measures to relive pain and suffering. May use oxygen, suction and manual treatment of airway obstruction as needed for comfort.      2017/06/09 1124    Code Status History    Date Active Date Inactive Code Status Order ID Comments User Context   05/07/2017  2:56 PM June 09, 2017 11:24 AM Partial Code [id number]  Lavina Hamman, MD Inpatient   05/07/2017  2:52 PM 05/07/2017  2:56 PM DNR [id number]  Lavina Hamman, MD Inpatient   04/23/2017 11:10 PM 05/07/2017  2:52 PM Full Code [id number]  Phillips Grout, MD Inpatient    Advance Directive Documentation     Most Recent Value  Type of Advance Directive  Living will  Pre-existing out of facility DNR order (yellow form or pink MOST form)  -  "MOST" Form in Place?  -       Prognosis:   Hours - Days  Discharge Planning:  Anticipated Hospital Death  Care plan was discussed with family at bedside, RN  Thank you for allowing the Palliative Medicine Team to assist in the care of this patient.   Time In: 1130 Time Out: 1200 Total Time 30min Prolonged Time Billed  no      Greater than 50%  of this time was spent counseling and coordinating care related to the above assessment and plan.  Ihor Dow, FNP-C Palliative Medicine Team  Phone: [phone] Fax: [phone]  Please contact Palliative Medicine Team phone at [phone] for questions and concerns.

## 2017-05-15 NOTE — Progress Notes (Signed)
   23-May-2017 0725  Vitals  BP (!) 74/52  MAP (mmHg) (!) 60  Pulse Rate 81  ECG Heart Rate 82  Resp (!) 28  Oxygen Therapy  SpO2 (!) 87 %  placed pt in reverse trend and bolus given

## 2017-05-15 NOTE — Progress Notes (Signed)
[facility] Progress Note  Patient: Kelly Wolfe IRC:[id number]   PCP: Leighton Ruff, MD DOB: 1931/03/30   DOA: 04/28/2017   DOS: 29-May-2017     Subjective: Called by the nurse this morning saying that the patient was not responding, had low blood pressure and was hypoxic. Seen at bedside. Patient lethargic. Does not respond.   Brief hospital course: Pt. with PMH of hypertension, pancytopenia, arthritis; admitted on 05/03/2017, presented with complaint of confusion, was found to have community-acquired pneumonia, acute CVA, worsening bicytopenia. Received IV antibiotics for community acquired pneumonia. On aspirin for acute CVA, EF 25% on echocardiogram. Initially received IV fluid due to severe hypotension, and then received IV Lasix due to volume overload. Also has acute kidney injury as well as acute transaminitis likely from hypotension. Ultrasound abdomen shows evidence of cholecystitis. EEG 2 shows metabolic encephalopathy.   Assessment and Plan: 1. Acute hypoxic respiratory failure. Community-acquired pneumonia. Severe sepsis Initially requiring nonrebreather to maintain adequate saturation. Now on 4 L of nasal cannula. Also was on BiPAP. Blood cultures so far negative. She was started on IV Levaquin, due to worsening respiratory distress reported coverage was broadened to vancomycin and cefepime. Now that MRSA PCR is negative as well as blood cultures are negative antibiotic coverage was narrowed down to Unasyn to cover intra-abdominal pathology as well as possible aspiration event along with community acquired pneumonia. Patient was also on IV steroids. BiPAP only as needed. Seems to be worsening this morning. Back on 100% nonrebreather. Blood pressure also low. Discussed with patient's daughters. No aggressive interventions. Mainly comfort care. Continue antibiotics for now. DO NOT RESUSCITATE.  2. Acute encephalopathy. Initially her encephalopathy was thought to be due to  medications. However, she did not improve. Had to be transferred to stepdown and placed on BiPAP. Sedative medications were placed on hold. Repeat CT scan did not show any evidence of acute intracranial bleed. MRI brain was positive for acute CVA and left basal ganglia which might contribute to confusion. Oral intake remains poor. She remains encephalopathic. Prognosis is very poor. Patient has declined significantly over the last 24 hours.   3. Left basal ganglia CVA.  Patient presented with confusion in the hospital.  This was thought secondary to delirium. CT head unremarkable.  MRI brain shows left basal ganglia infarct. Full stroke workup was completed. Echo shows EF of 25%. Carotid Doppler shows no significant stenosis. Hemoglobin A1c 5.5. Patient not a candidate for any aggressive workup or treatment. Case was briefly discussed with neurology.  4. 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 Follows up with hematology as an outpatient. Severe neutropenia on presentation with worsening in the setting of infection. Patient was given Granix with improvement in WBC and platelet count.  Bandemia is likely secondary to Granix.  5. Acute kidney injury. Dehydration with diarrhea prior to admission Uremia. Patient had diarrhea at home. Initial Acute kidney injury improved after aggressive IV hydration.  Now worsening again. Likely due to hemodynamic mediation from severe hypotension. Currently holding diuretics. Supportive measures.    6. Takatsubo cardiomyopathy. Patient presented with pneumonia, diarrhea, acute kidney injury, acute encephalopathy.  Overnight developed worsening hypoxia requiring BiPAP. Echo performed which shows EF of 25% with diffuse hypokinesis other than inferior wall. Discussed with cardiology, this is consistent with Takatsubo cardiomyopathy and recommended supportive management. Unfortunately due to hypotension cannot tolerate beta blocker. Patient did receive multiple  episodes bolus and had volume overload. Given IV Lasix. Currently on hold due to acute kidney injury.   7. Hypotension Patient's  blood pressure remains low. Her prognosis is very poor.  8. Transaminitis. Questionable acute cholecystitis AST ALT elevation consistent with alcohol-induced damage but further worsening is likely secondary to shock liver from hypotension. Ultrasound liver shows evidence of gallbladder wall thickening concerning for acute cholecystitis although the patient has been on IV ceftriaxone, cefepime as well as IV Unasyn since admission and thus adequately covered. Blood culture negative. No fever. Not candidate for any surgical intervention at present until shows improvement in medical condition. Currently avoid hepatotoxic medication and monitor. Ammonia level 41, will try lactulose enema and monitor. Prognosis poor.  9. Acute urinary retention. Likely secondary to encephalopathy. Foley catheter inserted. Monitor in and out.  10. Goals of care discussion. Patient has a living will that mentions"desire for natural death". It also clearly mentions regarding avoiding artificial feeding, CPR, respirator. Discussed with patient's daughters, Jeannene Patella And [name], changing CODE STATUS to partial. Palliative care consulted.  She continues to decline. Now with shallow respirations low blood pressure and hypoxia. Expect her to pass away in the next few hours to one or 2 days at most. Discussed again with family. DO NOT RESUSCITATE now. Comfort measures.   Diet: Currently nothing by mouth until fully awake. DVT Prophylaxis subcutaneous heparin  Family Communication: Discussed with patient's daughters  Disposition:  Will likely pass away in the hospital  Consultants: Phone consultation with CCM, hematology, neurology. Palliative care Procedures: Echocardiogram, BIPAP  Antibiotics: Anti-infectives    Start     Dose/Rate Route Frequency Ordered Stop   05/12/17 1000   Ampicillin-Sulbactam (UNASYN) 3 g in sodium chloride 0.9 % 100 mL IVPB     3 g 200 mL/hr over 30 Minutes Intravenous Every 24 hours 05-14-17 1134 05/16/17 0959   05/09/17 1045  Ampicillin-Sulbactam (UNASYN) 3 g in sodium chloride 0.9 % 100 mL IVPB  Status:  Discontinued     3 g 200 mL/hr over 30 Minutes Intravenous Every 12 hours 05/09/17 1040 05-14-17 1134   05/09/17 0800  piperacillin-tazobactam (ZOSYN) IVPB 3.375 g  Status:  Discontinued     3.375 g 12.5 mL/hr over 240 Minutes Intravenous Every 8 hours 05/09/17 0731 05/09/17 0903   05/08/17 1000  levofloxacin (LEVAQUIN) IVPB 500 mg  Status:  Discontinued    Comments:  Levaquin 500 mg IV q48h for CrCL < 30 mL/min   500 mg 100 mL/hr over 60 Minutes Intravenous Every 48 hours 05/06/17 0612 05/07/17 0751   05/08/17 0900  vancomycin (VANCOCIN) 500 mg in sodium chloride 0.9 % 100 mL IVPB  Status:  Discontinued     500 mg 100 mL/hr over 60 Minutes Intravenous Every 24 hours 05/07/17 0836 05/09/17 0722   05/07/17 0900  vancomycin (VANCOCIN) IVPB 1000 mg/200 mL premix     1,000 mg 200 mL/hr over 60 Minutes Intravenous  Once 05/07/17 0836 05/07/17 1119   05/07/17 0900  ceFEPIme (MAXIPIME) 1 g in dextrose 5 % 50 mL IVPB  Status:  Discontinued     1 g 100 mL/hr over 30 Minutes Intravenous Every 24 hours 05/07/17 0836 05/09/17 0722   05/06/17 0630  levofloxacin (LEVAQUIN) IVPB 750 mg     750 mg 100 mL/hr over 90 Minutes Intravenous  Once 05/06/17 0612 05/06/17 0824       Objective: Physical Exam: Vitals:   05-14-17 0824 May 14, 2017 1035 May 14, 2017 1100 05-14-2017 1446  BP:   (!) 50/34 (!) 39/31  Pulse: (!) 43 (!) 51 (!) 51   Resp: 17 20 17  (!) 28  Temp:  97.6 F (36.4 C)   TempSrc:   Axillary   SpO2: (!) 45% (!) 84% (!) 84%   Weight:      Height:        Intake/Output Summary (Last 24 hours) at 2017-05-27 1508 Last data filed at May 27, 2017 1109  Gross per 24 hour  Intake           1207.5 ml  Output              285 ml  Net            922.5  ml   Filed Weights   05/09/17 0500 05/10/17 1044 27-May-2017 0500  Weight: 55.7 kg (122 lb 12.7 oz) 54.1 kg (119 lb 4.3 oz) 54.3 kg (119 lb 11.2 oz)   General: Lethargic. Does not respond. Cardiovascular: S1, S2 is normal, regular. No S3, S4 Respiratory: Shallow breaths. Coarse breath sounds bilaterally. Abdomen: Abdomen is soft. Nontender. Nondistended. Extremities: No edema is noted Neurologic: Unresponsive.  Data Reviewed: CBC:  Recent Labs Lab 05/07/17 0354 05/08/17 0513 05/09/17 0217 05/10/17 0231 2017-05-27 0344  WBC 0.5* 10.3 15.1* 18.0* 18.4*  NEUTROABS 0.3* 9.5* 13.2* 15.1* 14.9*  HGB 12.8 14.0 13.6 14.2 14.7  HCT 37.1 41.0 39.9 41.7 44.0  MCV 103.3* 103.5* 103.4* 103.2* 108.4*  PLT 50* 95* 110* 102* 76*   Basic Metabolic Panel:  Recent Labs Lab 05/08/17 0407 05/09/17 0217 05/09/17 1658 05/10/17 0231 May 27, 2017 0344  NA 140 145 146* 148* 155*  K 3.9 3.7 3.6 3.5 4.0  CL 115* 117* 121* 123* 128*  CO2 15* 17* 16* 15* 19*  GLUCOSE 142* 173* 171* 165* 190*  BUN 36* 51* 60* 64* 74*  CREATININE 1.43* 1.99* 2.01* 2.09* 2.14*  CALCIUM 7.5* 7.6* 7.8* 7.6* 7.3*  MG 2.0  --   --   --   --   PHOS  --   --  3.3  --   --     Liver Function Tests:  Recent Labs Lab 05/07/17 0354 05/08/17 0407 05/09/17 0217 05/09/17 1658 05/10/17 0231 05-27-17 0344  AST 446* 574* 690*  --  589* 314*  ALT 57* 69* 98*  --  136* 154*  ALKPHOS 159* 218* 285*  --  293* 269*  BILITOT 1.0 1.0 1.0  --  1.0 0.9  PROT 4.5* 4.2* 4.2*  --  4.1* 4.1*  ALBUMIN 2.5* 2.4* 2.2* 2.3* 2.2* 2.2*   No results for input(s): LIPASE, AMYLASE in the last 168 hours.  Recent Labs Lab 04/22/2017 2103 05/09/17 1658 05/10/17 0231 05-27-17 0344  AMMONIA 17 41* 41* 54*   Coagulation Profile:  Recent Labs Lab 05/08/17 0407 05/09/17 1656  INR 1.10 1.49   Cardiac Enzymes:  Recent Labs Lab 04/28/2017 2204 04/30/2017 2320 05/06/17 0506 05/06/17 1333 05/09/17 1656 05/10/17 0231 05-27-2017 0344    CKTOTAL  --   --   --   --  869* 767* 553*  TROPONINI <0.03 0.03* 0.06* 0.06*  --   --   --    CBG:  Recent Labs Lab 05/08/17 0357 05/08/17 0814 05/08/17 1156 05/08/17 1620 05/09/17 2018  GLUCAP 133* 132* 151* 156* 159*   Studies: No results found.  Scheduled Meds: . dorzolamide-timolol  1 drop Both Eyes BID  . ipratropium  0.5 mg Nebulization Q6H  . latanoprost  1 drop Both Eyes QHS  . levalbuterol  0.63 mg Nebulization Q6H  . mouth rinse  15 mL Mouth Rinse BID  . sodium chloride flush  3  mL Intravenous Q12H  . sodium chloride flush  3 mL Intravenous Q12H   Continuous Infusions: . sodium chloride Stopped (05/09/17 2100)  . sodium chloride 50 mL/hr at 2017/06/01 1109  . [START ON 05/12/2017] ampicillin-sulbactam (UNASYN) IV     PRN Meds: sodium chloride, acetaminophen, glycopyrrolate, morphine injection, morphine CONCENTRATE, sodium chloride flush   Rilan Eiland 06-01-2017  Page: [phone]  If 7PM-7AM, please contact night-coverage at [email or web address], password [facility]

## 2017-05-15 NOTE — Progress Notes (Signed)
SLP Cancellation Note  Patient Details Name: Kelly Wolfe MRN: [id number] DOB: 1931/06/28   Cancelled treatment:       Reason Eval/Treat Not Completed: Medical issues which prohibited therapy (pt to transition to comfort care per MD notes, will sign off)   Luanna Salk, [facility] SLP [phone]

## 2017-05-15 NOTE — Progress Notes (Signed)
Dr Vernell Barrier came to room to assess pt. Will call family and to keep pt on comfort care

## 2017-05-15 NOTE — Progress Notes (Signed)
PT Cancellation Note  Patient Details Name: Kelly Wolfe MRN: [id number] DOB: August 01, 1931   Cancelled Treatment:    Reason Eval/Treat Not Completed: Medical issues which prohibited therapy Nursing asked that PT be held today as pt medical status has declined. PT will follow up tomorrow.  Amed Datta B. Migdalia Dk PT, DPT Acute Rehabilitation  [phone] Pager [phone]   [place] Jun 06, 2017, 8:37 AM

## 2017-05-15 NOTE — Progress Notes (Signed)
Pharmacy Antibiotic Note  Kelly Wolfe is a 83 y.o. female admitted on 05/03/2017 with sepsis.  Pharmacy has been consulted for Unasyn dosing. Per consult to be given for 7 days - stop date has been entered for 7/1.  The patient's renal function continues to worsen - SCr 2.14, CrCl~10-15 ml/min - warranting a dose adjustment.   Plan: 1. Adjust Unasyn to 3g IV every 24 hours 2. Stop date entered for 7/1 for a 7d LOT as MD as indicated  Height: 5' (152.4 cm) Weight: 119 lb 11.2 oz (54.3 kg) IBW/kg (Calculated) : 45.5  Temp (24hrs), Avg:97.8 F (36.6 C), Min:97.5 F (36.4 C), Max:98.5 F (36.9 C)   Recent Labs Lab 04/18/2017 2320 05/06/17 0147  05/07/17 0354 05/07/17 0706 05/07/17 1014 05/08/17 0407 05/08/17 0513 05/09/17 0217 05/09/17 1658 05/10/17 0231 2017/05/13 0344  WBC  --   --   < > 0.5*  --   --   --  10.3 15.1*  --  18.0* 18.4*  CREATININE  --   --   < > 0.96  --   --  1.43*  --  1.99* 2.01* 2.09* 2.14*  LATICACIDVEN 1.7 1.4  --   --  1.6 1.3  --   --   --   --   --   --   < > = values in this interval not displayed.  Estimated Creatinine Clearance: 14.1 mL/min (A) (by C-G formula based on SCr of 2.14 mg/dL (H)).    Allergies  Allergen Reactions  . Lisinopril Cough    Went away with DC (?)  . Prednisone Other (See Comments)    Nausea, shaky, unsteady, decreased appetite    Antimicrobials this admission: 6/22 Levaquin*1 6/23 Vanc > 6/24 6/23 Cefepime > 6/25 6/25 Unasyn > (7/1)  Dose adjustments this admission:  Microbiology results: 6/22 BCx > ngtd  Thank you for allowing pharmacy to be a part of this patient's care.  Lawson Radar 13-May-2017 11:32 AM

## 2017-05-15 NOTE — Progress Notes (Signed)
RT to D/C neb treatments due to patient condition.

## 2017-05-15 NOTE — Progress Notes (Signed)
Paged dr [place] Pink regarding pt sats droping 84, switch hfnc to nonrebreather. And bp dropping now 67/49. New orders rec'd to give 500cc bolus.

## 2017-05-15 NOTE — Progress Notes (Signed)
Nebulizer treatment not given at this time. I do not feel that patient would benefit from treatment at this time. Comfort care only. RT to treat if needed

## 2017-05-15 NOTE — Progress Notes (Signed)
   May 27, 2017 0713  Vitals  Temp 98.5 F (36.9 C)  Temp Source Axillary  BP (!) 70/46  MAP (mmHg) (!) 55  BP Location Right Arm  BP Method Automatic  Patient Position (if appropriate) Lying  Pulse Rate 81  Pulse Rate Source Monitor  ECG Heart Rate 81  Cardiac Rhythm NSR  Resp (!) 27  Oxygen Therapy  SpO2 91 %  paged dr [place] Pink

## 2017-05-15 NOTE — Progress Notes (Signed)
Donnivan Villena RN and Phineas Douglas RN auscultated for one minute with no heart sounds. Pt asystole on monitor. Family at bedside at time of death. Pt passed at [place].

## 2017-05-15 NOTE — Progress Notes (Signed)
Pt expired at 1847 hrs, pronounced by 2 RNs. Pt was a DNR and was comfort care only. Death was expected. Death certificate completed and given to unit secretary, Jonelle Sidle.  KJKG, NP [facility]

## 2017-05-15 DEATH — deceased
